# Patient Record
Sex: Female | Born: 1980 | Race: Black or African American | Hispanic: No | Marital: Married | Smoking: Former smoker
Health system: Southern US, Community
[De-identification: ages and names within clinical notes are randomized; demographics above are authoritative.]

## PROBLEM LIST (undated history)

## (undated) DIAGNOSIS — E669 Obesity, unspecified: Secondary | ICD-10-CM

## (undated) DIAGNOSIS — F419 Anxiety disorder, unspecified: Secondary | ICD-10-CM

## (undated) DIAGNOSIS — I10 Essential (primary) hypertension: Secondary | ICD-10-CM

## (undated) DIAGNOSIS — E119 Type 2 diabetes mellitus without complications: Secondary | ICD-10-CM

## (undated) HISTORY — DX: Obesity, unspecified: E66.9

## (undated) HISTORY — PX: OTHER SURGICAL HISTORY: SHX169

## (undated) HISTORY — DX: Essential (primary) hypertension: I10

## (undated) HISTORY — DX: Anxiety disorder, unspecified: F41.9

---

## 2005-01-04 ENCOUNTER — Other Ambulatory Visit: Payer: Self-pay

## 2005-01-04 ENCOUNTER — Emergency Department: Payer: Self-pay | Admitting: Emergency Medicine

## 2006-01-26 ENCOUNTER — Emergency Department: Payer: Self-pay | Admitting: Emergency Medicine

## 2006-11-27 ENCOUNTER — Emergency Department: Payer: Self-pay | Admitting: Emergency Medicine

## 2007-07-21 ENCOUNTER — Emergency Department: Payer: Self-pay | Admitting: Internal Medicine

## 2007-10-23 ENCOUNTER — Ambulatory Visit: Payer: Self-pay | Admitting: Obstetrics and Gynecology

## 2008-05-31 ENCOUNTER — Emergency Department: Payer: Self-pay | Admitting: Emergency Medicine

## 2008-09-29 ENCOUNTER — Emergency Department: Payer: Self-pay

## 2008-10-29 ENCOUNTER — Ambulatory Visit: Payer: Self-pay | Admitting: Obstetrics and Gynecology

## 2008-11-06 ENCOUNTER — Ambulatory Visit: Payer: Self-pay | Admitting: Obstetrics and Gynecology

## 2008-12-24 ENCOUNTER — Ambulatory Visit: Payer: Self-pay | Admitting: Family Medicine

## 2009-02-28 ENCOUNTER — Emergency Department: Payer: Self-pay | Admitting: Emergency Medicine

## 2009-03-15 ENCOUNTER — Ambulatory Visit: Payer: Self-pay | Admitting: Family Medicine

## 2009-08-14 ENCOUNTER — Emergency Department: Payer: Self-pay | Admitting: Emergency Medicine

## 2009-12-21 ENCOUNTER — Emergency Department: Payer: Self-pay | Admitting: Emergency Medicine

## 2011-01-17 ENCOUNTER — Ambulatory Visit: Payer: Self-pay | Admitting: Obstetrics and Gynecology

## 2011-02-11 ENCOUNTER — Observation Stay: Payer: Self-pay

## 2011-02-13 ENCOUNTER — Ambulatory Visit: Payer: Self-pay | Admitting: Obstetrics and Gynecology

## 2011-06-06 ENCOUNTER — Ambulatory Visit: Payer: Self-pay | Admitting: Obstetrics and Gynecology

## 2011-06-06 LAB — CBC WITH DIFFERENTIAL/PLATELET
Basophil #: 0 10*3/uL (ref 0.0–0.1)
Eosinophil #: 0.1 10*3/uL (ref 0.0–0.7)
Eosinophil %: 1 %
HCT: 33.7 % — ABNORMAL LOW (ref 35.0–47.0)
HGB: 10.9 g/dL — ABNORMAL LOW (ref 12.0–16.0)
Lymphocyte %: 19.3 %
MCHC: 32.4 g/dL (ref 32.0–36.0)
Monocyte %: 6.3 %
Neutrophil #: 7.9 10*3/uL — ABNORMAL HIGH (ref 1.4–6.5)
WBC: 10.9 10*3/uL (ref 3.6–11.0)

## 2011-06-07 ENCOUNTER — Inpatient Hospital Stay: Payer: Self-pay | Admitting: Obstetrics and Gynecology

## 2011-06-08 LAB — HEMATOCRIT: HCT: 30.1 % — ABNORMAL LOW (ref 35.0–47.0)

## 2011-07-21 ENCOUNTER — Ambulatory Visit: Payer: Self-pay | Admitting: Obstetrics and Gynecology

## 2012-03-12 ENCOUNTER — Emergency Department: Payer: Self-pay | Admitting: Emergency Medicine

## 2012-03-15 LAB — BETA STREP CULTURE(ARMC)

## 2012-08-28 ENCOUNTER — Emergency Department: Payer: Self-pay | Admitting: Emergency Medicine

## 2012-08-30 LAB — BETA STREP CULTURE(ARMC)

## 2013-10-15 ENCOUNTER — Emergency Department: Payer: Self-pay | Admitting: Emergency Medicine

## 2014-09-06 NOTE — Discharge Summary (Signed)
PATIENT NAME:  Frances Turner, Frances Turner MR#:  [id number] DATE OF BIRTH:  1981-02-04  DATE OF ADMISSION:  06/07/2011 DATE OF DISCHARGE:  06/10/2011  PRINCIPLE PROCEDURE: Elective repeat cesarean section.   HOSPITAL COURSE: Postoperatively the patient did well. Postoperative day number one hematocrit 30.1%. The patient was discharged to home on postoperative day number three in good condition.   Her discharge medications are prenatal vitamins, Vicodin, and Motrin.   The patient's staples were removed and Steri-Strips applied.   The patient will follow-up with Dr. Feliberto GottronSchermerhorn in two weeks for wound care or before if she has wound drainage, fever, nausea, vomiting, or wound separation. ____________________________ Suzy Bouchardhomas J. Schermerhorn, MD tjs:slb D: 06/21/2011 09:24:47 ET T: 06/21/2011 11:36:50 ET JOB#: [id number]  cc: Suzy Bouchardhomas J. Schermerhorn, MD, <Dictator> Suzy BouchardHOMAS J SCHERMERHORN MD ELECTRONICALLY SIGNED 06/22/2011 8:44

## 2014-09-06 NOTE — Op Note (Signed)
PATIENT NAME:  Frances Turner, [name] MR#:  [id number] DATE OF BIRTH:  1981/05/05  DATE OF PROCEDURE:  06/07/2011  PREOPERATIVE DIAGNOSES:  1. Elective repeat cesarean section.  2. Elective permanent sterilization.   POSTOPERATIVE DIAGNOSES:  1. Elective repeat cesarean section.  2. Elective permanent sterilization.   PROCEDURES:  1. Repeat low transverse cesarean section.  2. Adhesiolysis. 3. Bilateral tubal ligation, [place].   SURGEON: Jennell Cornerhomas Vickie Ponds, MD  FIRST ASSISTANT: Dolman - Scrub Tech  ANESTHESIA: Spinal.   INDICATION: This is a 34 year old gravida 3, para 2, a patient with two prior cesarean sections. The patient's estimate date of confinement is 06/14/2011. The patient elects for repeat cesarean section and permanent sterilization. The patient reconfirms her desire to have permanent sterilization on the day of the procedure.   DESCRIPTION OF PROCEDURE: After adequate spinal anesthesia, the patient was placed in the dorsal supine position with a hip roll under the right side. The patient previously received 3 grams of IV Ancef. The abdomen was prepped and draped in normal sterile fashion. A Pfannenstiel incision was made two fingerbreadths above the symphysis pubis. Sharp dissection was used to identify the fascia. The fascia was opened in the midline and opened in a transverse fashion. The peritoneal cavity was entered sharply. Multiple peritoneal adhesions to the lower uterine segment and sidewalls were removed sharply. A low transverse uterine incision was made. Upon entry into the endometrial cavity, clear fluid resulted. The fetal head was brought to the incision, vacuum applied to the fetal occiput, and the fetal head delivered without difficulty with one gentle pull. The vacuum was removed. The shoulders and body were removed without difficulty. A loose nuchal cord was reduced, a vigorous female,  the cord was doubly clamped, and the infant was passed to Dr. Awanda MinkGaliote who  assigned Apgar scores of 9 and 9. The placenta was manually delivered and the uterus was exteriorized, intravenous Pitocin administered. The endometrial cavity was wiped clean with laparotomy tape. The cervix was opened with a ring forceps and this was passed off the operative field. The uterine incision was closed with one chromic suture in a running locking fashion with good approximation of edges. Good hemostasis was noted. Several additional figure-of-eight sutures were applied on the anterior portion of the uterine serosa given generous oozing. Several sutures were placed with good hemostasis noted. The fallopian tubes and ovaries appeared normal. The posterior cul-de-sac was suctioned and attention directed to the patient's right fallopian tube which was grasped at the midportion of the fallopian tube. Two separate 0 plain gut sutures were placed. A 1.5 cm portion of the fallopian tube was removed. A similar procedure was repeated on the patient's left fallopian tube. After placing 2-0 plain gut sutures, a 1.5 cm portion of the fallopian tube was removed. Good hemostasis was noted. Fallopian tube segments will be sent to pathology for identification. The uterus was placed back into the abdominal cavity. The paracolic gutters were wiped clean with laparotomy tape. The tubal ligation sites appeared hemostatic bilaterally. The uterine incision appeared hemostatic. Interceed was placed over the serosal edge and the uterine incision. An 0-Vicryl suture was used to close the fascia. The fascia was closed without defect. Good hemostasis was noted. The subcutaneous tissues were irrigated and bovied for hemostasis. Dense scarring of the Scarpa's, at the inferior edge of the incision, was opened with the Bovie and allowed for loosening of the tissues. The skin was reapproximated with staples, sterile dressing applied. The patient tolerated the procedure well. There were  no complications. Estimated blood 700 mL.  Intraoperative fluids 1000 mL.  ____________________________ Suzy Bouchard, MD tjs:slb D: 06/07/2011 08:47:27 ET T: 06/07/2011 09:31:24 ET JOB#: [id number]  cc: Suzy Bouchard, MD, <Dictator> Suzy Bouchard MD ELECTRONICALLY SIGNED 06/08/2011 20:33

## 2014-10-02 ENCOUNTER — Encounter: Payer: Self-pay | Admitting: Emergency Medicine

## 2014-10-02 ENCOUNTER — Emergency Department
Admission: EM | Admit: 2014-10-02 | Discharge: 2014-10-02 | Disposition: A | Discharge status: Home / Self Care | Payer: Self-pay | Attending: Emergency Medicine | Admitting: Emergency Medicine

## 2014-10-02 ENCOUNTER — Emergency Department: Payer: Self-pay

## 2014-10-02 DIAGNOSIS — M25511 Pain in right shoulder: Secondary | ICD-10-CM | POA: Insufficient documentation

## 2014-10-02 DIAGNOSIS — Z720 Tobacco use: Secondary | ICD-10-CM | POA: Insufficient documentation

## 2014-10-02 DIAGNOSIS — G8929 Other chronic pain: Secondary | ICD-10-CM | POA: Insufficient documentation

## 2014-10-02 DIAGNOSIS — Z79899 Other long term (current) drug therapy: Secondary | ICD-10-CM | POA: Insufficient documentation

## 2014-10-02 MED ORDER — ETODOLAC 400 MG PO TABS
400.0000 mg | ORAL_TABLET | Freq: Two times a day (BID) | ORAL | Status: DC
Start: 2014-10-02 — End: 2017-07-04

## 2014-10-02 NOTE — ED Notes (Signed)
Patient reports right shoulder pain for a couple of months. 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 Also reports some hand tingling.

## 2014-10-02 NOTE — ED Provider Notes (Signed)
[facility] Emergency Department Provider Note  ____________________________________________  Time seen: Approximately 9:36 AM  I have reviewed the triage vital signs and the nursing notes.   HISTORY  Chief Complaint Shoulder Pain  HPI Frances Turner is a 34 y.o. female is in today with complaint of right shoulder pain for 3 months. She is unaware of any particular injury that is caused this. 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 The last 4 days she has been taking ibuprofen 2 every 4 hours without much relief. Range of motion increases her pain sitting still decreases the pain. Pain currently is an 8 out of 10. She does not have a primary care doctor.   History reviewed. No pertinent past medical history.  There are no active problems to display for this patient.   Past Surgical History  Procedure Laterality Date  . Cesarean section      Current Outpatient Rx  Name  Route  Sig  Dispense  Refill  . Multiple Vitamin (MULTIVITAMIN) tablet   Oral   Take 1 tablet by mouth daily.         Marland Kitchen. etodolac (LODINE) 400 MG tablet   Oral   Take 1 tablet (400 mg total) by mouth 2 (two) times daily.   20 tablet   0     Allergies Review of patient's allergies indicates no known allergies.  Family History  Problem Relation Age of Onset  . Diabetes Mother   . Lupus Mother   . Osteoarthritis Mother   . Hyperlipidemia Mother   . Diabetes Father   . Hyperlipidemia Father   . Hypertension Father     Social History History  Substance Use Topics  . Smoking status: Current Every Day Smoker -- 0.50 packs/day  . Smokeless tobacco: Never Used  . Alcohol Use: No    Review of Systems Constitutional: No fever/chills Eyes: No visual changes. ENT: No sore throat. Cardiovascular: Denies chest pain. Respiratory: Denies shortness of breath. Gastrointestinal: No abdominal pain.  No nausea, no  vomiting. Genitourinary: Negative for dysuria. Musculoskeletal: Negative for back pain. Skin: Negative for rash. Neurological: Negative for headaches, focal weakness or numbness.  10-point ROS otherwise negative.  ____________________________________________   PHYSICAL EXAM:  VITAL SIGNS: ED Triage Vitals  Enc Vitals Group     BP 10/02/14 0842 142/89 mmHg     Pulse Rate 10/02/14 0842 78     Resp 10/02/14 0842 16     Temp 10/02/14 0842 98 F (36.7 C)     Temp Source 10/02/14 0842 Oral     SpO2 10/02/14 0842 100 %     Weight 10/02/14 0842 220 lb (99.791 kg)     Height 10/02/14 0842 5\' 7"  (1.702 m)     Head Cir --      Peak Flow --      Pain Score 10/02/14 0843 8     Pain Loc --      Pain Edu? --      Excl. in GC? --     Constitutional: Alert and oriented. Well appearing and in no acute distress. Eyes: Conjunctivae are normal. PERRL. EOMI. Head: Atraumatic. Nose: No congestion/rhinnorhea. Neck: No stridor.  No cervical tenderness on palpation. Cardiovascular: Normal rate, regular rhythm. Grossly normal heart sounds.  Good peripheral circulation. Respiratory: Normal respiratory effort.  No retractions. Lungs CTAB. Gastrointestinal: Soft and nontender. No distention. No abdominal bruits. Musculoskeletal: No  lower extremity tenderness nor edema.  No joint effusions. Range of motion in the right shoulder decreased secondary to pain. There is no crepitus noted. No gross deformity or discoloration. Trapezius muscles tenderness on palpation Neurologic:  Normal speech and language. No gross focal neurologic deficits are appreciated. Speech is normal. No gait instability. Skin:  Skin is warm, dry and intact. No rash noted. Psychiatric: Mood and affect are normal. Speech and behavior are normal.  ____________________________________________   LABS (all labs ordered are listed, but only abnormal results are displayed)  Labs Reviewed - No data to display RADIOLOGY  Right  shoulder x-rays per radiologist are negative. ____________________________________________   PROCEDURES  Procedure(s) performed: None  Critical Care performed: No  ____________________________________________   INITIAL IMPRESSION / ASSESSMENT AND PLAN / ED COURSE  Pertinent labs & imaging results that were available during my care of the patient were reviewed by me and considered in my medical decision making (see chart for details).  Patient was started on Lodine 400 mg twice a day. She is also to follow-up with Dr. Rosita KeaMenz if any continued problems ____________________________________________   FINAL CLINICAL IMPRESSION(S) / ED DIAGNOSES  Final diagnoses:  Chronic shoulder pain, right      Tommi RumpsRhonda L Janith Nielson, PA-C 10/02/14 1023  Governor Rooksebecca Lord, MD 10/02/14 1120

## 2015-03-17 ENCOUNTER — Emergency Department: Payer: Self-pay

## 2015-03-17 ENCOUNTER — Emergency Department
Admission: EM | Admit: 2015-03-17 | Discharge: 2015-03-17 | Disposition: A | Discharge status: Home / Self Care | Payer: Self-pay | Attending: Emergency Medicine | Admitting: Emergency Medicine

## 2015-03-17 ENCOUNTER — Encounter: Payer: Self-pay | Admitting: Emergency Medicine

## 2015-03-17 DIAGNOSIS — Y93B1 Activity, exercise machines primarily for muscle strengthening: Secondary | ICD-10-CM | POA: Insufficient documentation

## 2015-03-17 DIAGNOSIS — Z791 Long term (current) use of non-steroidal anti-inflammatories (NSAID): Secondary | ICD-10-CM | POA: Insufficient documentation

## 2015-03-17 DIAGNOSIS — S76011A Strain of muscle, fascia and tendon of right hip, initial encounter: Secondary | ICD-10-CM | POA: Insufficient documentation

## 2015-03-17 DIAGNOSIS — Y998 Other external cause status: Secondary | ICD-10-CM | POA: Insufficient documentation

## 2015-03-17 DIAGNOSIS — Y9289 Other specified places as the place of occurrence of the external cause: Secondary | ICD-10-CM | POA: Insufficient documentation

## 2015-03-17 DIAGNOSIS — Z720 Tobacco use: Secondary | ICD-10-CM | POA: Insufficient documentation

## 2015-03-17 DIAGNOSIS — G8929 Other chronic pain: Secondary | ICD-10-CM | POA: Insufficient documentation

## 2015-03-17 DIAGNOSIS — X58XXXA Exposure to other specified factors, initial encounter: Secondary | ICD-10-CM | POA: Insufficient documentation

## 2015-03-17 DIAGNOSIS — Z79899 Other long term (current) drug therapy: Secondary | ICD-10-CM | POA: Insufficient documentation

## 2015-03-17 MED ORDER — CYCLOBENZAPRINE HCL 10 MG PO TABS: 10.0000 mg | ORAL_TABLET | Freq: Three times a day (TID) | ORAL | Status: DC | PRN

## 2015-03-17 MED ORDER — IBUPROFEN 800 MG PO TABS: 800.0000 mg | ORAL_TABLET | Freq: Three times a day (TID) | ORAL | Status: DC | PRN

## 2015-03-17 MED ORDER — TRAMADOL HCL 50 MG PO TABS: 50.0000 mg | ORAL_TABLET | Freq: Four times a day (QID) | ORAL | Status: DC | PRN

## 2015-03-17 MED ORDER — KETOROLAC TROMETHAMINE 60 MG/2ML IM SOLN
60.0000 mg | Freq: Once | INTRAMUSCULAR | Status: AC
  Administered 2015-03-17: 60 mg via INTRAMUSCULAR
  Filled 2015-03-17: qty 2

## 2015-03-17 MED ORDER — ORPHENADRINE CITRATE 30 MG/ML IJ SOLN
60.0000 mg | Freq: Two times a day (BID) | INTRAMUSCULAR | Status: DC
  Administered 2015-03-17: 60 mg via INTRAMUSCULAR
  Filled 2015-03-17: qty 2

## 2015-03-17 NOTE — ED Provider Notes (Signed)
[facility] Emergency Department Provider Note  ____________________________________________  Time seen: Approximately 9:31 AM  I have reviewed the triage vital signs and the nursing notes.   HISTORY  Chief Complaint Hip Pain    HPI Frances Turner is a 34 y.o. female chronic hip pain for 2 years secondary to a fall. No medical evaluation after fall. Patient has been intermittently and worsening yesterday after doing Zumba exercises. No palliative measures taken for this complaint after the exercises. Patient states the pain increase ambulation. Patient is rating the pain as a 9/10.   History reviewed. No pertinent past medical history.  There are no active problems to display for this patient.   Past Surgical History  Procedure Laterality Date  . Cesarean section      Current Outpatient Rx  Name  Route  Sig  Dispense  Refill  . cyclobenzaprine (FLEXERIL) 10 MG tablet   Oral   Take 1 tablet (10 mg total) by mouth every 8 (eight) hours as needed for muscle spasms.   15 tablet   0   . etodolac (LODINE) 400 MG tablet   Oral   Take 1 tablet (400 mg total) by mouth 2 (two) times daily.   20 tablet   0   . ibuprofen (ADVIL,MOTRIN) 800 MG tablet   Oral   Take 1 tablet (800 mg total) by mouth every 8 (eight) hours as needed for moderate pain.   15 tablet   0   . Multiple Vitamin (MULTIVITAMIN) tablet   Oral   Take 1 tablet by mouth daily.         . traMADol (ULTRAM) 50 MG tablet   Oral   Take 1 tablet (50 mg total) by mouth every 6 (six) hours as needed for moderate pain.   12 tablet   0     Allergies Review of patient's allergies indicates no known allergies.  Family History  Problem Relation Age of Onset  . Diabetes Mother   . Lupus Mother   . Osteoarthritis Mother   . Hyperlipidemia Mother   . Diabetes Father   . Hyperlipidemia Father   . Hypertension Father     Social History Social History  Substance Use Topics  .  Smoking status: Current Every Day Smoker -- 0.50 packs/day  . Smokeless tobacco: Never Used  . Alcohol Use: No    Review of Systems Constitutional: No fever/chills Eyes: No visual changes. ENT: No sore throat. Cardiovascular: Denies chest pain. Respiratory: Denies shortness of breath. Gastrointestinal: No abdominal pain.  No nausea, no vomiting.  No diarrhea.  No constipation. Genitourinary: Negative for dysuria. Musculoskeletal: Right hip pain  Skin: Negative for rash. Neurological: Negative for headaches, focal weakness or numbness. 10-point ROS otherwise negative.  ____________________________________________   PHYSICAL EXAM:  VITAL SIGNS: ED Triage Vitals  Enc Vitals Group     BP 03/17/15 0926 136/89 mmHg     Pulse Rate 03/17/15 0926 83     Resp 03/17/15 0926 20     Temp 03/17/15 0926 98 F (36.7 C)     Temp Source 03/17/15 0926 Oral     SpO2 03/17/15 0926 98 %     Weight 03/17/15 0926 250 lb (113.399 kg)     Height 03/17/15 0926  (1.702 m)     Head Cir --      Peak Flow --      Pain Score 03/17/15 0926 9     Pain Loc --  Pain Edu? --      Excl. in GC? --    Constitutional: Alert and oriented. Well appearing and in no acute distress. Eyes: Conjunctivae are normal. PERRL. EOMI. Head: Atraumatic. Nose: No congestion/rhinnorhea. Mouth/Throat: Mucous membranes are moist.  Oropharynx non-erythematous. Neck: No stridor.  No cervical spine tenderness to palpation. Hematological/Lymphatic/Immunilogical: No cervical lymphadenopathy. Cardiovascular: Normal rate, regular rhythm. Grossly normal heart sounds.  Good peripheral circulation. Respiratory: Normal respiratory effort.  No retractions. Lungs CTAB. Gastrointestinal: Soft and nontender. No distention. No abdominal bruits. No CVA tenderness. Musculoskeletal: No lower extremity tenderness nor edema.  No joint effusions. Neurologic:  Normal speech and language. No gross focal neurologic deficits are  appreciated. No gait instability. Skin:  Skin is warm, dry and intact. No rash noted. Psychiatric: Mood and affect are normal. Speech and behavior are normal.  ____________________________________________   LABS (all labs ordered are listed, but only abnormal results are displayed)  Labs Reviewed - No data to display ____________________________________________  EKG   ____________________________________________  RADIOLOGY  No acute findings on x-ray ____________________________________________   PROCEDURES  Procedure(s) performed: None  Critical Care performed: No  ____________________________________________   INITIAL IMPRESSION / ASSESSMENT AND PLAN / ED COURSE  Pertinent labs & imaging results that were available during my care of the patient were reviewed by me and considered in my medical decision making (see chart for details).  Strain left hip. Discussed x-ray findings with patient. Patient given a prescription for tramadol, ibuprofen, and Flexeril. Patient given a work note for 2 days. Patient [facility]. ____________________________________________   FINAL CLINICAL IMPRESSION(S) / ED DIAGNOSES  Final diagnoses:  Hip strain, right, initial encounter      Joni ReiningRonald K Smith, PA-C 03/17/15 1023  Minna AntisKevin Paduchowski, MD 03/17/15 1538

## 2015-03-17 NOTE — ED Notes (Signed)
Left hip pain from fall two years ago. Pain on and off for two years but yesterday after doing some zumba exercises. Pt c/o increased pain in left hip when walking.

## 2015-03-17 NOTE — ED Notes (Signed)
Patient transported to X-ray 

## 2015-09-28 ENCOUNTER — Encounter: Payer: Self-pay | Admitting: Sports Medicine

## 2015-09-28 ENCOUNTER — Ambulatory Visit (INDEPENDENT_AMBULATORY_CARE_PROVIDER_SITE_OTHER): Payer: BLUE CROSS/BLUE SHIELD

## 2015-09-28 ENCOUNTER — Ambulatory Visit (INDEPENDENT_AMBULATORY_CARE_PROVIDER_SITE_OTHER): Payer: BLUE CROSS/BLUE SHIELD | Admitting: Sports Medicine

## 2015-09-28 DIAGNOSIS — M79673 Pain in unspecified foot: Secondary | ICD-10-CM

## 2015-09-28 DIAGNOSIS — M722 Plantar fascial fibromatosis: Secondary | ICD-10-CM | POA: Diagnosis not present

## 2015-09-28 DIAGNOSIS — L603 Nail dystrophy: Secondary | ICD-10-CM

## 2015-09-28 MED ORDER — METHYLPREDNISOLONE 4 MG PO TBPK: ORAL_TABLET | ORAL | Status: DC

## 2015-09-28 MED ORDER — MELOXICAM 15 MG PO TABS: 15.0000 mg | ORAL_TABLET | Freq: Every day | ORAL | Status: DC

## 2015-09-28 NOTE — Progress Notes (Signed)
Patient ID: Frances Turner, female   DOB: 10-26-1980, 35 y.o.   MRN: [id number] Subjective: Frances Turner is a 35 y.o. female patient presents to office with complaint of heel pain bilateral. Patient admits to post static dyskinesia for 1 year in duration. Patient has treated this problem with stretching with no relief.Admits also to concerning big toe nail thickness, desiring treatment. Denies any other pedal complaints.   There are no active problems to display for this patient.   Current Outpatient Prescriptions on File Prior to Visit  Medication Sig Dispense Refill  . cyclobenzaprine (FLEXERIL) 10 MG tablet Take 1 tablet (10 mg total) by mouth every 8 (eight) hours as needed for muscle spasms. 15 tablet 0  . etodolac (LODINE) 400 MG tablet Take 1 tablet (400 mg total) by mouth 2 (two) times daily. 20 tablet 0  . ibuprofen (ADVIL,MOTRIN) 800 MG tablet Take 1 tablet (800 mg total) by mouth every 8 (eight) hours as needed for moderate pain. 15 tablet 0  . Multiple Vitamin (MULTIVITAMIN) tablet Take 1 tablet by mouth daily.    . traMADol (ULTRAM) 50 MG tablet Take 1 tablet (50 mg total) by mouth every 6 (six) hours as needed for moderate pain. 12 tablet 0   No current facility-administered medications on file prior to visit.    No Known Allergies  Objective: Physical Exam General: The patient is alert and oriented x3 in no acute distress.  Dermatology: Skin is warm, dry and supple bilateral lower extremities. Nails 1-10 are  Short with thickness at bilateral hallux nails. There is no erythema, edema, no eccymosis, no open lesions present. Integument is otherwise unremarkable.  Vascular: Dorsalis Pedis pulse and Posterior Tibial pulse are 2/4 bilateral. Capillary fill time is immediate to all digits.  Neurological: Grossly intact to light touch with an achilles reflex of +2/5 and a  negative Tinel's sign bilateral.  Musculoskeletal: Tenderness to palpation at the medial calcaneal  tubercale and through the insertion of the plantar fascia on the left and right foot. No pain with compression of calcaneus bilateral. No pain with tuning fork to calcaneus bilateral. No pain with calf compression bilateral. There is decreased Ankle joint range of motion bilateral. All other joints range of motion within normal limits bilateral. Mild hammertoe bilateral. Strength 5/5 in all groups bilateral.   Xray, Right and Left foot:  Normal osseous mineralization. Joint spaces preserved. Mild hammertoe. No fracture/dislocation/boney destruction. Calcaneal spur present with mild thickening of plantar fascia. No other soft tissue abnormalities or radiopaque foreign bodies.   Assessment and Plan: Problem List Items Addressed This Visit    None    Visit Diagnoses    Foot pain, unspecified laterality    -  Primary    Relevant Medications    methylPREDNISolone (MEDROL DOSEPAK) 4 MG TBPK tablet    meloxicam (MOBIC) 15 MG tablet    Other Relevant Orders    DG Foot 2 Views Left    DG Foot 2 Views Right    Plantar fasciitis, bilateral        Relevant Medications    methylPREDNISolone (MEDROL DOSEPAK) 4 MG TBPK tablet    meloxicam (MOBIC) 15 MG tablet    Nail dystrophy        Relevant Orders    Fungus Culture with Smear       -Complete examination performed.  -Xrays reviewed -Discussed with patient in detail the condition of plantar fasciitis, how this occurs and general treatment options. Explained both conservative and surgical treatments.  -  Patient declined injection  -Rx Meloxicam to start after Medrol dose pack is completed -Recommended good supportive shoes and advised use of OTC insert. Explained to patient that if these orthoses work well, we will continue with these. If these do not improve her condition and  pain, we will consider custom molded orthoses. - Explained in detail the use of the fascial brace bilateral which was dispensed at today's visit. -Explained and dispensed to  patient daily stretching exercises. -Recommend patient to ice affected area 1-2x daily. -Fungal culture obtained from bilateral hallux nails and sent to [facility] -Patient to return to office in 4 weeks for follow up nail culture results and eval plantar fascia bilateral or sooner if problems or questions arise.  Frances Turner, DPM

## 2015-09-28 NOTE — Patient Instructions (Signed)

## 2015-10-25 ENCOUNTER — Other Ambulatory Visit: Payer: Self-pay | Admitting: Sports Medicine

## 2015-10-26 ENCOUNTER — Telehealth: Payer: Self-pay | Admitting: *Deleted

## 2015-10-26 ENCOUNTER — Encounter: Payer: Self-pay | Admitting: Sports Medicine

## 2015-10-26 ENCOUNTER — Ambulatory Visit: Payer: BLUE CROSS/BLUE SHIELD | Admitting: Sports Medicine

## 2015-10-26 ENCOUNTER — Ambulatory Visit (INDEPENDENT_AMBULATORY_CARE_PROVIDER_SITE_OTHER): Payer: BLUE CROSS/BLUE SHIELD | Admitting: Sports Medicine

## 2015-10-26 DIAGNOSIS — M79673 Pain in unspecified foot: Secondary | ICD-10-CM | POA: Diagnosis not present

## 2015-10-26 DIAGNOSIS — L603 Nail dystrophy: Secondary | ICD-10-CM

## 2015-10-26 DIAGNOSIS — M722 Plantar fascial fibromatosis: Secondary | ICD-10-CM | POA: Diagnosis not present

## 2015-10-26 MED ORDER — NUVAIL EX SOLN: 1.0000 [drp] | Freq: Every day | CUTANEOUS | Status: DC

## 2015-10-26 NOTE — Telephone Encounter (Addendum)
-----   Message from Asencion Islamitorya Stover, [place] sent at 10/26/2015 10:58 AM EDT ----- Regarding: Vickii ChafeNuvail Order Nuvail to apply to both big toe nails daily. [facility] to [facility].

## 2015-10-26 NOTE — Telephone Encounter (Signed)
Pt needs to make an appt prior to future refills. 

## 2015-10-26 NOTE — Progress Notes (Signed)
Patient ID: Frances FreestoneShonya Turner, female   DOB: April 25, 1981, 35 y.o.   MRN: [id number] Subjective: Frances Turner is a 35 y.o. female patient seen today in office for fungal culture results. Patient is also here for follow up of plantar fasciitis L>R. States that meds and brace helps. States that she wants to get injection this time as recommended. Patient has no other pedal complaints at this time.   There are no active problems to display for this patient.   Current Outpatient Prescriptions on File Prior to Visit  Medication Sig Dispense Refill  . cyclobenzaprine (FLEXERIL) 10 MG tablet Take 1 tablet (10 mg total) by mouth every 8 (eight) hours as needed for muscle spasms. 15 tablet 0  . etodolac (LODINE) 400 MG tablet Take 1 tablet (400 mg total) by mouth 2 (two) times daily. 20 tablet 0  . ibuprofen (ADVIL,MOTRIN) 800 MG tablet Take 1 tablet (800 mg total) by mouth every 8 (eight) hours as needed for moderate pain. 15 tablet 0  . meloxicam (MOBIC) 15 MG tablet Take 1 tablet (15 mg total) by mouth daily. 30 tablet 0  . methylPREDNISolone (MEDROL DOSEPAK) 4 MG TBPK tablet Take 1st as instructed then when done take anti-inflamatory 21 tablet 0  . Multiple Vitamin (MULTIVITAMIN) tablet Take 1 tablet by mouth daily.    . traMADol (ULTRAM) 50 MG tablet Take 1 tablet (50 mg total) by mouth every 6 (six) hours as needed for moderate pain. 12 tablet 0   No current facility-administered medications on file prior to visit.    No Known Allergies  Objective: Physical Exam  General: Well developed, nourished, no acute distress, awake, alert and oriented x 3  Vascular: Dorsalis pedis artery 2/4 bilateral, Posterior tibial artery 2/4 bilateral, skin temperature warm to warm proximal to distal bilateral lower extremities, no varicosities, pedal hair present bilateral.  Neurological: Gross sensation present via light touch bilateral.   Dermatological: Skin is warm, dry, and supple bilateral, Nails 1-10 are  tender, short thick, and discolored with mild subungal debris especially at bilateral hallux, no webspace macerations present bilateral, no open lesions present bilateral, no callus/corns/hyperkeratotic tissue present bilateral. No signs of infection bilateral.  Musculoskeletal: Mild tenderness to plantar fascia at medial insertion on calcaneus L>R. No pain with tuning fork to calcaneus or with compression bilateral. Asymptomatic hammertoe and pes planus boney deformities noted bilateral. Muscular strength within normal limits without painon range of motion. No pain with calf compression bilateral.  Fungal culture Negative suggestive of microtrauma  Assessment and Plan:  Problem List Items Addressed This Visit    None    Visit Diagnoses    Nail dystrophy    -  Primary    Plantar fasciitis, bilateral        L>R    Foot pain, unspecified laterality           -Examined patient -Discussed treatment options for painful dystrophic nails -Encouraged biomechanical changes/shoe changes -Rx Nuvail -Advised good hygiene habits -After oral consent and aseptic prep, injected a mixture containing 1 ml of 2%  plain lidocaine, 1 ml 0.5% plain marcaine, 0.5 ml of kenalog 10 and 0.5 ml of dexamethasone phosphate into left heel without complication. Post-injection care discussed with patient.  -Continue with icing, stretching, good supportive shoes, fascial braces; will determine wean at next visit -Patient to return in 4 weeks for follow up evaluation or sooner if symptoms worsen.  Asencion Islamitorya Ayat Drenning, DPM

## 2015-11-11 DIAGNOSIS — E161 Other hypoglycemia: Secondary | ICD-10-CM | POA: Insufficient documentation

## 2015-11-23 ENCOUNTER — Encounter: Payer: Self-pay | Admitting: Sports Medicine

## 2015-11-23 ENCOUNTER — Ambulatory Visit (INDEPENDENT_AMBULATORY_CARE_PROVIDER_SITE_OTHER): Payer: BLUE CROSS/BLUE SHIELD | Admitting: Sports Medicine

## 2015-11-23 DIAGNOSIS — M79673 Pain in unspecified foot: Secondary | ICD-10-CM | POA: Diagnosis not present

## 2015-11-23 DIAGNOSIS — M722 Plantar fascial fibromatosis: Secondary | ICD-10-CM

## 2015-11-23 DIAGNOSIS — L603 Nail dystrophy: Secondary | ICD-10-CM

## 2015-11-23 NOTE — Progress Notes (Signed)
Patient ID: Frances Turner, female   DOB: [birth year], 35 y.o.   MRN: [id number]  Subjective: Frances Turner is a 35 y.o. female patient seen today in office for follow up eval of nails and for follow up plantar fasciitis, L>R. States that meds and brace helps. States injection helped and has no pain now. Patient has no other pedal complaints at this time.   Patient Active Problem List   Diagnosis Date Noted  . Hypoglycemic reaction 11/11/2015    Current Outpatient Prescriptions on File Prior to Visit  Medication Sig Dispense Refill  . cyclobenzaprine (FLEXERIL) 10 MG tablet Take 1 tablet (10 mg total) by mouth every 8 (eight) hours as needed for muscle spasms. 15 tablet 0  . Dermatological Products, Misc. (NUVAIL) SOLN Apply 1 drop topically daily. 1 Bottle 11  . etodolac (LODINE) 400 MG tablet Take 1 tablet (400 mg total) by mouth 2 (two) times daily. 20 tablet 0  . ibuprofen (ADVIL,MOTRIN) 800 MG tablet Take 1 tablet (800 mg total) by mouth every 8 (eight) hours as needed for moderate pain. 15 tablet 0  . meloxicam (MOBIC) 15 MG tablet TAKE 1 TABLET (15 MG TOTAL) BY MOUTH DAILY. 30 tablet 0  . methylPREDNISolone (MEDROL DOSEPAK) 4 MG TBPK tablet Take 1st as instructed then when done take anti-inflamatory 21 tablet 0  . Multiple Vitamin (MULTIVITAMIN) tablet Take 1 tablet by mouth daily.    . traMADol (ULTRAM) 50 MG tablet Take 1 tablet (50 mg total) by mouth every 6 (six) hours as needed for moderate pain. 12 tablet 0   No current facility-administered medications on file prior to visit.    No Known Allergies  Objective: Physical Exam  General: Well developed, nourished, no acute distress, awake, alert and oriented x 3  Vascular: Dorsalis pedis artery 2/4 bilateral, Posterior tibial artery 2/4 bilateral, skin temperature warm to warm proximal to distal bilateral lower extremities, no varicosities, pedal hair present bilateral.  Neurological: Gross sensation present via light touch  bilateral.   Dermatological: Skin is warm, dry, and supple bilateral, Nails 1-10 are tender, short thick, and discolored with mild subungal debris especially at bilateral hallux, currently using Nuvail, no webspace macerations present bilateral, no open lesions present bilateral, no callus/corns/hyperkeratotic tissue present bilateral. No signs of infection bilateral.  Musculoskeletal:Decreased tenderness to plantar fascia at medial insertion on calcaneus L>R. No pain with tuning fork to calcaneus or with compression bilateral. Asymptomatic hammertoe and pes planus boney deformities noted bilateral. Muscular strength within normal limits without painon range of motion. No pain with calf compression bilateral.  Assessment and Plan:  Problem List Items Addressed This Visit    None    Visit Diagnoses    Plantar fasciitis, bilateral    -  Primary    improved    Foot pain, unspecified laterality        Nail dystrophy        Using Nuvail      -Examined patient -Continue with Nuvail as Rx -Advised continue good hygiene habits -No re-injection at this time due to improved fasciitis symptoms.  -Continue with icing, stretching, good supportive shoes, fascial brace as needed for left, wean as instructed. Advised patient in future may benefit from custom orthotics  -Patient to return as needed for follow up evaluation or sooner if symptoms worsen.  Asencion Islamitorya Cheo Selvey, DPM

## 2016-01-29 ENCOUNTER — Emergency Department
Admission: EM | Admit: 2016-01-29 | Discharge: 2016-01-29 | Disposition: A | Discharge status: Home / Self Care | Payer: BLUE CROSS/BLUE SHIELD | Attending: Emergency Medicine | Admitting: Emergency Medicine

## 2016-01-29 ENCOUNTER — Encounter: Payer: Self-pay | Admitting: Emergency Medicine

## 2016-01-29 DIAGNOSIS — E119 Type 2 diabetes mellitus without complications: Secondary | ICD-10-CM | POA: Diagnosis not present

## 2016-01-29 DIAGNOSIS — M545 Low back pain, unspecified: Secondary | ICD-10-CM

## 2016-01-29 DIAGNOSIS — Z79899 Other long term (current) drug therapy: Secondary | ICD-10-CM | POA: Diagnosis not present

## 2016-01-29 DIAGNOSIS — F17200 Nicotine dependence, unspecified, uncomplicated: Secondary | ICD-10-CM | POA: Diagnosis not present

## 2016-01-29 HISTORY — DX: Type 2 diabetes mellitus without complications: E11.9

## 2016-01-29 MED ORDER — DEXAMETHASONE SODIUM PHOSPHATE 10 MG/ML IJ SOLN
INTRAMUSCULAR | Status: AC
  Administered 2016-01-29: 10 mg via ORAL
  Filled 2016-01-29: qty 1

## 2016-01-29 MED ORDER — DEXAMETHASONE 10 MG/ML FOR PEDIATRIC ORAL USE
10.0000 mg | Freq: Once | INTRAMUSCULAR | Status: AC
  Administered 2016-01-29: 10 mg via ORAL

## 2016-01-29 MED ORDER — CYCLOBENZAPRINE HCL 5 MG PO TABS: 5.0000 mg | ORAL_TABLET | Freq: Three times a day (TID) | ORAL | 0 refills | Status: DC | PRN

## 2016-01-29 MED ORDER — PREDNISONE 10 MG PO TABS: 10.0000 mg | ORAL_TABLET | Freq: Every day | ORAL | 0 refills | Status: DC

## 2016-01-29 NOTE — ED Notes (Signed)
Pt states she has herniated disk and chronic back pain.

## 2016-01-29 NOTE — Discharge Instructions (Signed)
Please take medications as prescribed. Use a heating pad return to the ER for any worsening symptoms urgent changes in her health. Follow-up with orthopedics if no improvement in 5-7 days.

## 2016-01-29 NOTE — ED Provider Notes (Signed)
[facility]-EMERGENCY DEPARTMENT Provider Note   CSN: [id number] Arrival date & time: 01/29/16  1751     History   Chief Complaint Chief Complaint  Patient presents with  . Back Pain    HPI Frances Turner is a 35 y.o. female presents to the emergency department for evaluation of lower back pain. Patient states Thursday of this week, 2 days ago she developed midline lower back pain she describes an ache and tightness as constant and increased with standing and walking. She gets some relief with lying down. She denies any pain numbness tingling or radicular symptoms going on her lower extremities. No abdominal pain urinary symptoms, loss of bowel or bladder symptoms. She has been taking ibuprofen with no improvement. She's had similar symptoms in the past, found to have a L5-S1 disc herniation, has not been treated with any type of the assigned injections and currently does not have an orthopedist. Patient states prior to Thursday she was doing well with no symptoms. No trauma or injury.  HPI  Past Medical History:  Diagnosis Date  . Diabetes mellitus without complication [facility])     Patient Active Problem List   Diagnosis Date Noted  . Hypoglycemic reaction 11/11/2015    Past Surgical History:  Procedure Laterality Date  . CESAREAN SECTION      OB History    No data available       Home Medications    Prior to Admission medications   Medication Sig Start Date End Date Taking? Authorizing Provider  cyclobenzaprine (FLEXERIL) 5 MG tablet Take 1-2 tablets (5-10 mg total) by mouth 3 (three) times daily as needed for muscle spasms. 01/29/16   Evon Slack, PA-C  Dermatological Products, Misc. (NUVAIL) SOLN Apply 1 drop topically daily. 10/26/15   [name], DPM  etodolac (LODINE) 400 MG tablet Take 1 tablet (400 mg total) by mouth 2 (two) times daily. 10/02/14   Tommi Rumps, PA-C  ibuprofen (ADVIL,MOTRIN) 800 MG tablet Take 1 tablet (800 mg total) by mouth every 8 (eight)  hours as needed for moderate pain. 03/17/15   Joni Reining, PA-C  meloxicam (MOBIC) 15 MG tablet TAKE 1 TABLET (15 MG TOTAL) BY MOUTH DAILY. 10/26/15   [name], DPM  methylPREDNISolone (MEDROL DOSEPAK) 4 MG TBPK tablet Take 1st as instructed then when done take anti-inflamatory 09/28/15   [name], DPM  Multiple Vitamin (MULTIVITAMIN) tablet Take 1 tablet by mouth daily.    Historical Provider, MD  predniSONE (DELTASONE) 10 MG tablet Take 1 tablet (10 mg total) by mouth daily. 6,5,4,3,2,1 six day taper 01/29/16   Evon Slack, PA-C  traMADol (ULTRAM) 50 MG tablet Take 1 tablet (50 mg total) by mouth every 6 (six) hours as needed for moderate pain. 03/17/15   Joni Reining, PA-C    Family History Family History  Problem Relation Age of Onset  . Diabetes Mother   . Lupus Mother   . Osteoarthritis Mother   . Hyperlipidemia Mother   . Diabetes Father   . Hyperlipidemia Father   . Hypertension Father     Social History Social History  Substance Use Topics  . Smoking status: Current Every Day Smoker    Packs/day: 0.50  . Smokeless tobacco: Never Used  . Alcohol use No     Allergies   Review of patient's allergies indicates no known allergies.   Review of Systems Review of Systems  Constitutional: Negative for activity change, chills, fatigue and fever.  HENT: Negative for  congestion, sinus pressure and sore throat.   Eyes: Negative for visual disturbance.  Respiratory: Negative for cough, chest tightness and shortness of breath.   Cardiovascular: Negative for chest pain and leg swelling.  Gastrointestinal: Negative for abdominal pain, diarrhea, nausea and vomiting.  Genitourinary: Negative for dysuria.  Musculoskeletal: Positive for back pain. Negative for arthralgias and gait problem.  Skin: Negative for rash.  Neurological: Negative for weakness, numbness and headaches.  Hematological: Negative for adenopathy.  Psychiatric/Behavioral: Negative for agitation,  behavioral problems and confusion.     Physical Exam Updated Vital Signs BP (!) 97/47   Pulse 92   Temp 98.2 F (36.8 C) (Oral)   Resp 18   Ht 5\' 7"  (1.702 m)   Wt 119.3 kg   LMP 01/05/2016   SpO2 100%   BMI 41.19 kg/m   Physical Exam  Constitutional: She is oriented to person, place, and time. She appears well-developed and well-nourished. No distress.  HENT:  Head: Normocephalic and atraumatic.  Mouth/Throat: Oropharynx is clear and moist.  Eyes: EOM are normal. Pupils are equal, round, and reactive to light. Right eye exhibits no discharge. Left eye exhibits no discharge.  Neck: Normal range of motion. Neck supple.  Cardiovascular: Normal rate, regular rhythm and intact distal pulses.   Pulmonary/Chest: Effort normal and breath sounds normal. No respiratory distress. She exhibits no tenderness.  Abdominal: Soft. She exhibits no distension. There is no tenderness.  Musculoskeletal: She exhibits no edema.  Lumbar Spine: Examination of the lumbar spine reveals no bony abnormality, no edema, and no ecchymosis.  There is no step off.  The patient has full range of motion of the lumbar spine with flexion and extension.  The patient has normal lateral bend and rotation.  The patient has no pain with range of motion activities.  The patient has a negative axial load test, and a negative rotational Waddell test.  The patient is non tender along the spinous process.  The patient is  tender along the paravertebral muscles, with no muscle spasms.  The patient is non tender along the iliac crest.  The patient is non tender in the sciatic notch.  The patient is non tender along the Sacroiliac joint.  There is no Coccyx joint tenderness.    Bilateral Lower Extremities: Examination of the lower extremities reveals no bony abnormality, no edema, and no ecchymosis.  The patient has full active and passive range of motion of the hips, knees, and ankles.  There is no discomfort with range of motion  exercises.  The patient is non tender along the greater trochanter region.  The patient has a negative Denna HaggardHomans' test bilaterally.  There is normal skin warmth.  There is normal capillary refill bilaterally.    Neurologic: The patient has a negative straight leg raise.  The patient has normal muscle strength testing for the quadriceps, calves, ankle dorsiflexion, ankle plantarflexion, and extensor hallicus longus.  The patient has sensation that is intact to light touch.  The deep tendon reflexes are normal at the patella and achilles.   Neurological: She is alert and oriented to person, place, and time. She has normal reflexes.  Skin: Skin is warm and dry.  Psychiatric: She has a normal mood and affect. Her behavior is normal. Thought content normal.     ED Treatments / Results  Labs (all labs ordered are listed, but only abnormal results are displayed) Labs Reviewed - No data to display  EKG  EKG Interpretation None  Radiology No results found.  Procedures Procedures (including critical care time)  Medications Ordered in ED Medications  dexamethasone (DECADRON) 10 MG/ML injection for Pediatric ORAL use 10 mg (not administered)     Initial Impression / Assessment and Plan / ED Course  I have reviewed the triage vital signs and the nursing notes.  Pertinent labs & imaging results that were available during my care of the patient were reviewed by me and considered in my medical decision making (see chart for details).  Clinical Course    35 year old female with nontraumatic midline lower back pain. Has a history of lumbar disc herniation. She denies any radicular symptoms or weakness in the lower extremities. Patient's had similar symptoms in the past that responded well to steroids and muscle relaxers. We'll give 10 mg of dexamethasone IM followed by 6 day steroid taper with Flexeril.  Final Clinical Impressions(s) / ED Diagnoses   Final diagnoses:  Midline low back  pain without sciatica    New Prescriptions New Prescriptions   CYCLOBENZAPRINE (FLEXERIL) 5 MG TABLET    Take 1-2 tablets (5-10 mg total) by mouth 3 (three) times daily as needed for muscle spasms.   PREDNISONE (DELTASONE) 10 MG TABLET    Take 1 tablet (10 mg total) by mouth daily. 6,5,4,3,2,1 six day taper     Evon Slack, PA-C 01/29/16 1915    Jeanmarie Plant, MD 01/29/16 [phone]

## 2016-01-29 NOTE — ED Triage Notes (Signed)
Low back pain x 2 days, no new injury, history of same.

## 2016-03-01 ENCOUNTER — Encounter: Payer: Self-pay | Admitting: Podiatry

## 2016-03-01 ENCOUNTER — Ambulatory Visit (INDEPENDENT_AMBULATORY_CARE_PROVIDER_SITE_OTHER): Payer: BLUE CROSS/BLUE SHIELD | Admitting: Podiatry

## 2016-03-01 DIAGNOSIS — Q828 Other specified congenital malformations of skin: Secondary | ICD-10-CM

## 2016-03-01 DIAGNOSIS — M71571 Other bursitis, not elsewhere classified, right ankle and foot: Secondary | ICD-10-CM | POA: Diagnosis not present

## 2016-03-01 DIAGNOSIS — M2041 Other hammer toe(s) (acquired), right foot: Secondary | ICD-10-CM | POA: Diagnosis not present

## 2016-03-01 DIAGNOSIS — M7751 Other enthesopathy of right foot: Secondary | ICD-10-CM

## 2016-03-01 NOTE — Progress Notes (Signed)
She presents today with a chief complaint of a painful fifth digit of the right foot she states that "it's just killing me". States has been like this for quite some time and is far she can remember her fifth toes have always set up higher than the rest of her toes. I have reviewed her past medical history she denies changes in her past medical history medications allergies surgery social history.  Objective: Vital signs are stable alert and oriented 3 pulses are strongly palpable. She has contracted fifth metatarsophalangeal joint with skin contracture and soft tissue contracture as well. She has an overlying reactive hyperkeratotic lesion with soft fluctuance beneath it dorsolateral aspect of the PIPJ. There is no signs of infection. This does feel like a bursitis. This is exquisitely painful for her.  Assessment: Pain in limb secondary to cockup hammertoe deformity fifth right. Bursitis. Reactive hyperkeratosis.  Plan: I injected the bursitis today with dexamethasone and local anesthetic. I debrided all reactive hyperkeratosis. Stress surgical intervention with her today consisting of a release of the fifth metatarsophalangeal joint and a derotational arthroplasty with pinning of the fifth toe.  X-rays will need to be taken next time because our x-ray machine was down today.

## 2016-03-08 ENCOUNTER — Ambulatory Visit (INDEPENDENT_AMBULATORY_CARE_PROVIDER_SITE_OTHER): Payer: BLUE CROSS/BLUE SHIELD

## 2016-03-08 ENCOUNTER — Ambulatory Visit (INDEPENDENT_AMBULATORY_CARE_PROVIDER_SITE_OTHER): Payer: BLUE CROSS/BLUE SHIELD | Admitting: Podiatry

## 2016-03-08 VITALS — BP 129/83 | HR 90 | Resp 16

## 2016-03-08 DIAGNOSIS — M79674 Pain in right toe(s): Secondary | ICD-10-CM | POA: Diagnosis not present

## 2016-03-08 DIAGNOSIS — M2041 Other hammer toe(s) (acquired), right foot: Secondary | ICD-10-CM

## 2016-03-08 DIAGNOSIS — M21629 Bunionette of unspecified foot: Secondary | ICD-10-CM | POA: Diagnosis not present

## 2016-03-08 NOTE — Patient Instructions (Signed)
Pre-Operative Instructions  Congratulations, you have decided to take an important step to improving your quality of life.  You can be assured that the doctors of [facility] will be with you every step of the way.  1. Plan to be at the surgery center/hospital at least 1 (one) hour prior to your scheduled time unless otherwise directed by the surgical center/hospital staff.  You must have a responsible adult accompany you, remain during the surgery and drive you home.  Make sure you have directions to the surgical center/hospital and know how to get there on time. 2. For hospital based surgery you will need to obtain a history and physical form from your family physician within 1 month prior to the date of surgery- we will give you a form for you primary physician.  3. We make every effort to accommodate the date you request for surgery.  There are however, times where surgery dates or times have to be moved.  We will contact you as soon as possible if a change in schedule is required.   4. No Aspirin/Ibuprofen for one week before surgery.  If you are on aspirin, any non-steroidal anti-inflammatory medications (Mobic, Aleve, Ibuprofen) you should stop taking it 7 days prior to your surgery.  You make take Tylenol  For pain prior to surgery.  5. Medications- If you are taking daily heart and blood pressure medications, seizure, reflux, allergy, asthma, anxiety, pain or diabetes medications, make sure the surgery center/hospital is aware before the day of surgery so they may notify you which medications to take or avoid the day of surgery. 6. No food or drink after midnight the night before surgery unless directed otherwise by surgical center/hospital staff. 7. No alcoholic beverages 24 hours prior to surgery.  No smoking 24 hours prior to or 24 hours after surgery. 8. Wear loose pants or shorts- loose enough to fit over bandages, boots, and casts. 9. No slip on shoes, sneakers are best. 10. Bring  your boot with you to the surgery center/hospital.  Also bring crutches or a walker if your physician has prescribed it for you.  If you do not have this equipment, it will be provided for you after surgery. 11. If you have not been contracted by the surgery center/hospital by the day before your surgery, call to confirm the date and time of your surgery. 12. Leave-time from work may vary depending on the type of surgery you have.  Appropriate arrangements should be made prior to surgery with your employer. 13. Prescriptions will be provided immediately following surgery by your doctor.  Have these filled as soon as possible after surgery and take the medication as directed. 14. Remove nail polish on the operative foot. 15. Wash the night before surgery.  The night before surgery wash the foot and leg well with the antibacterial soap provided and water paying special attention to beneath the toenails and in between the toes.  Rinse thoroughly with water and dry well with a towel.  Perform this wash unless told not to do so by your physician.  Enclosed: 1 Ice pack (please put in freezer the night before surgery)   1 Hibiclens skin cleaner   Pre-op Instructions  If you have any questions regarding the instructions, do not hesitate to call our office.  [place]: [address] [place], [place] [address] [phone]  [place]: [address] [place], [place] [address] [phone]  [place]: [address]  [place], [place] [address] [phone]   Dr.   Norman Regal DPM, Dr. Matthew Wagoner DPM, Dr. M. Todd Hyatt DPM, Dr. Titorya Stover DPM 

## 2016-03-08 NOTE — Progress Notes (Signed)
She presents today for surgical consult regarding her right foot. She states it is not getting any better and the injection only lasted about 3 days.  Objective: Vital signs are stable she is alert and oriented 3. Pulses are palpable. She has a cocked up hammertoe deformity fifth right with pain on palpation of the fifth metatarsophalangeal joint and the PIP joint of the right fifth toe. 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 Hammertoe deformity with exostosis fifth digit right foot. No open lesions or wounds are noted.  Assessment: Pain in limb secondary to capsulitis tailor's bunion deformity hammertoe deformity fifth right.  Plan: We consented her today for a fifth metatarsal osteotomy a release of the metatarsophalangeal joint and hammertoe repair on the fifth metatarsophalangeal joint and fifth toe right foot. We discussed the possible postoperative palpitations which may include but are not limited to postop pain bleeding slightly infection recurrence and need for further surgery overcorrection and under correction. She understands this is amenable to it. She understands that she will be out of work possibly for as long as 8-12 weeks sono 3 pages of the consent form and a cam walker was provided for postop recovery period.

## 2016-03-09 ENCOUNTER — Telehealth: Payer: Self-pay | Admitting: *Deleted

## 2016-03-09 NOTE — Telephone Encounter (Signed)
"  I want to schedule my surgery with Dr. Al CorpusHyatt."  Do you have a date in mind?  I would like to do it as soon as possible."  He can do it the afternoon of November 9.  "That date will be fine."  You will need to register with the surgical center, instructions are in the brochure that was given to you in the blue bag.  Someone from the surgical center will call you a day or two prior to surgery date with the arrival time.  "Okay, thank you."

## 2016-03-13 ENCOUNTER — Telehealth: Payer: Self-pay | Admitting: *Deleted

## 2016-03-13 NOTE — Telephone Encounter (Signed)
Patient was scheduled for 03/23/2016.

## 2016-03-13 NOTE — Telephone Encounter (Signed)
"  I'm calling to schedule a new surgical appointment.  If you would please give me a call back."

## 2016-03-14 ENCOUNTER — Encounter: Payer: Self-pay | Admitting: Podiatry

## 2016-03-21 ENCOUNTER — Encounter: Payer: Self-pay | Admitting: Podiatry

## 2016-03-23 ENCOUNTER — Other Ambulatory Visit: Payer: Self-pay | Admitting: Podiatry

## 2016-03-23 ENCOUNTER — Encounter: Payer: Self-pay | Admitting: Podiatry

## 2016-03-23 DIAGNOSIS — M2041 Other hammer toe(s) (acquired), right foot: Secondary | ICD-10-CM

## 2016-03-23 DIAGNOSIS — S91114A Laceration without foreign body of right lesser toe(s) without damage to nail, initial encounter: Secondary | ICD-10-CM | POA: Diagnosis not present

## 2016-03-23 DIAGNOSIS — M7751 Other enthesopathy of right foot: Secondary | ICD-10-CM | POA: Diagnosis not present

## 2016-03-23 MED ORDER — CEPHALEXIN 500 MG PO CAPS: 500.0000 mg | ORAL_CAPSULE | Freq: Three times a day (TID) | ORAL | 0 refills | Status: DC

## 2016-03-23 MED ORDER — PROMETHAZINE HCL 25 MG PO TABS: 25.0000 mg | ORAL_TABLET | Freq: Three times a day (TID) | ORAL | 0 refills | Status: DC | PRN

## 2016-03-23 MED ORDER — OXYCODONE-ACETAMINOPHEN 10-325 MG PO TABS: 1.0000 | ORAL_TABLET | ORAL | 0 refills | Status: DC | PRN

## 2016-03-24 ENCOUNTER — Telehealth: Payer: Self-pay | Admitting: *Deleted

## 2016-03-24 NOTE — Telephone Encounter (Signed)
Pt states the Percocet is not taking care of the pain, just makes drowsy.  I asked pt to describe the pain and she states it is sharp. I asked pt if she could take OTC antiinflammatories and she said she could, I told her to take Ibuprofen OTC 2 tablets every 4-6 hours between the Percocet and to not be on the surgery foot more than 15 minutes/hour and to rest and ice. Pt states understanding.

## 2016-03-29 ENCOUNTER — Encounter: Payer: Self-pay | Admitting: Podiatry

## 2016-03-29 ENCOUNTER — Ambulatory Visit (INDEPENDENT_AMBULATORY_CARE_PROVIDER_SITE_OTHER): Payer: BLUE CROSS/BLUE SHIELD

## 2016-03-29 ENCOUNTER — Ambulatory Visit (INDEPENDENT_AMBULATORY_CARE_PROVIDER_SITE_OTHER): Payer: BLUE CROSS/BLUE SHIELD | Admitting: Podiatry

## 2016-03-29 VITALS — BP 135/77 | HR 81 | Resp 16

## 2016-03-29 DIAGNOSIS — Z9889 Other specified postprocedural states: Secondary | ICD-10-CM

## 2016-03-29 DIAGNOSIS — M2041 Other hammer toe(s) (acquired), right foot: Secondary | ICD-10-CM

## 2016-03-29 DIAGNOSIS — M21629 Bunionette of unspecified foot: Secondary | ICD-10-CM

## 2016-03-29 DIAGNOSIS — M2040 Other hammer toe(s) (acquired), unspecified foot: Secondary | ICD-10-CM

## 2016-03-29 NOTE — Progress Notes (Signed)
She presents today 1 week status post release fifth metatarsophalangeal joint hammertoe repair fifth right with K wire placement and skin plasty. She's doing very well I see no signs of infection. She is not complaining of pain.  Objective: Rest of dressing intact does not demonstrate any type of dirt or debris no bleeding. Once removed demonstrates mild bleeding D no signs of infection. No erythema cellulitis drainage or odor sutures intact margins well coapted skin flap is living even the most proximal margin is doing very well. Radiographs taken today do demonstrate K wire to the fifth digit with no signs of fracture or infection.  Assessment: Well healing surgical foot fifth metatarsophalangeal joint.  Plan: redress today dry sterile compressive dressing continues to help as much possible follow-up with me in 1 week for suture removal

## 2016-03-31 ENCOUNTER — Encounter: Payer: BLUE CROSS/BLUE SHIELD | Admitting: Podiatry

## 2016-04-10 ENCOUNTER — Ambulatory Visit (INDEPENDENT_AMBULATORY_CARE_PROVIDER_SITE_OTHER): Payer: BLUE CROSS/BLUE SHIELD | Admitting: Podiatry

## 2016-04-10 DIAGNOSIS — M21629 Bunionette of unspecified foot: Secondary | ICD-10-CM

## 2016-04-10 DIAGNOSIS — Z9889 Other specified postprocedural states: Secondary | ICD-10-CM

## 2016-04-10 DIAGNOSIS — M2041 Other hammer toe(s) (acquired), right foot: Secondary | ICD-10-CM

## 2016-04-10 MED ORDER — OXYCODONE-ACETAMINOPHEN 10-325 MG PO TABS: 1.0000 | ORAL_TABLET | ORAL | 0 refills | Status: DC | PRN

## 2016-04-11 NOTE — Progress Notes (Signed)
She presents today for her second postop visit she is status post skinplasty and release fifth metatarsophalangeal joint with K wire and hammertoe repair. She states that still having a lot of pain with the pin.  Objective: Sutures are in place margins appear to be well coapted. Appears to be healing nicely. There is no erythema cellulitis drainage or odor. K wire is in place to the fifth digit. No drainage no purulence mild edema.  Assessment: Well-healing surgical foot  Plan removed sutures today and redressed dry sterile compressive dressing follow-up with her in 2 weeks. Placed her in a Darco shoe today.

## 2016-04-26 ENCOUNTER — Encounter: Payer: Self-pay | Admitting: Podiatry

## 2016-04-26 ENCOUNTER — Ambulatory Visit (INDEPENDENT_AMBULATORY_CARE_PROVIDER_SITE_OTHER): Payer: BLUE CROSS/BLUE SHIELD | Admitting: Podiatry

## 2016-04-26 ENCOUNTER — Ambulatory Visit (INDEPENDENT_AMBULATORY_CARE_PROVIDER_SITE_OTHER): Payer: BLUE CROSS/BLUE SHIELD

## 2016-04-26 DIAGNOSIS — M79671 Pain in right foot: Secondary | ICD-10-CM

## 2016-04-26 DIAGNOSIS — R52 Pain, unspecified: Secondary | ICD-10-CM

## 2016-04-26 NOTE — Progress Notes (Signed)
She presents today for release of the fifth metatarsophalangeal joint date of surgery 03/23/2016 on the right foot. K wires intact she states that the K wire has been turning in the toe is still in the same position.  Objective: Vital signs are stable she is alert and oriented 3 K wires in good position sutures have gone on to the removed and margins are well healed. Radial breast today demonstrate good alignment of the toe think we can take this K wire out today.  Assessment: Nonsurgical foot right.  Plan: I remove the K wire today in suitable discomfort to the fifth metatarsophalangeal joint area she tolerated this but it was quite tender upon ambulating afterwards. I suggested that she continue to ambulate in the Darco shoe for the next 2-3 days and then consider a tennis shoe. On follow-up with her in 3-4 weeks.

## 2016-04-28 NOTE — Progress Notes (Signed)
DOS 11.09.2017 Derotational Arthroplasty 5th Toe Right, and Metatarsal Phalangeal Joint Release 5th Right; Possible 5th Metatarsal Osteotomy with Screw Right

## 2016-05-01 ENCOUNTER — Encounter: Payer: Self-pay | Admitting: Podiatry

## 2016-05-17 ENCOUNTER — Ambulatory Visit (INDEPENDENT_AMBULATORY_CARE_PROVIDER_SITE_OTHER): Payer: Self-pay | Admitting: Podiatry

## 2016-05-17 ENCOUNTER — Encounter: Payer: Self-pay | Admitting: Podiatry

## 2016-05-17 ENCOUNTER — Ambulatory Visit (INDEPENDENT_AMBULATORY_CARE_PROVIDER_SITE_OTHER): Payer: BLUE CROSS/BLUE SHIELD

## 2016-05-17 DIAGNOSIS — Z9889 Other specified postprocedural states: Secondary | ICD-10-CM | POA: Diagnosis not present

## 2016-05-17 DIAGNOSIS — M21629 Bunionette of unspecified foot: Secondary | ICD-10-CM

## 2016-05-17 DIAGNOSIS — M2041 Other hammer toe(s) (acquired), right foot: Secondary | ICD-10-CM

## 2016-05-17 NOTE — Progress Notes (Signed)
She presents today for follow-up of her fifth metatarsal phalangeal joint release right foot. Date of surgery 03/23/2016. States that she is doing okay but he gets a little achiness swells occasionally.  Objective: Vital signs are stable alert and oriented 3. Pulses are palpable. Neurologic sensorium is intact. She has great position of the fifth toe mild edema radiographs demonstrate complete arthroplasty PIPJ fifth digit right foot.  Assessment: Nonsurgical right.  Plan: Follow up with me in 1 month to 6 weeks. I recommended she get back to her regular activities.

## 2016-06-28 ENCOUNTER — Encounter: Payer: Self-pay | Admitting: Podiatry

## 2016-06-28 ENCOUNTER — Ambulatory Visit (INDEPENDENT_AMBULATORY_CARE_PROVIDER_SITE_OTHER): Payer: Self-pay | Admitting: Podiatry

## 2016-06-28 ENCOUNTER — Ambulatory Visit (INDEPENDENT_AMBULATORY_CARE_PROVIDER_SITE_OTHER): Payer: BLUE CROSS/BLUE SHIELD

## 2016-06-28 DIAGNOSIS — M21629 Bunionette of unspecified foot: Secondary | ICD-10-CM | POA: Diagnosis not present

## 2016-06-28 DIAGNOSIS — M2041 Other hammer toe(s) (acquired), right foot: Secondary | ICD-10-CM | POA: Diagnosis not present

## 2016-06-28 NOTE — Progress Notes (Signed)
She presents today for postop visit date of surgery 03/23/2016 for her final postop visit status post release of the fifth metatarsophalangeal joint with a skinplasty and hammertoe repair fifth right. States that she's been getting some shooting pains from the toe up into the foot.  Objective: Vital signs are stable she is alert and oriented 3. Pulses are palpable. Neurologic sensorium is intact. She has tenderness on palpation of the fifth digit along the incision site. All of the reactive hyperkeratosis to the toe and plantar aspect of the foot have resolved.  Assessment: While healing surgical foot with retention of scar tissue fifth digit right foot.  Plan: Operative injection today she declined. I instructed her to massage the scar tissue help break up the scar tissue to release the nerve from the entrapment. She understands this is amenable to it will notify me with questions or concerns.

## 2016-07-25 DIAGNOSIS — J309 Allergic rhinitis, unspecified: Secondary | ICD-10-CM | POA: Insufficient documentation

## 2016-12-27 ENCOUNTER — Emergency Department: Payer: BLUE CROSS/BLUE SHIELD

## 2016-12-27 ENCOUNTER — Encounter: Payer: Self-pay | Admitting: Emergency Medicine

## 2016-12-27 ENCOUNTER — Emergency Department
Admission: EM | Admit: 2016-12-27 | Discharge: 2016-12-27 | Disposition: A | Discharge status: Home / Self Care | Payer: BLUE CROSS/BLUE SHIELD | Attending: Emergency Medicine | Admitting: Emergency Medicine

## 2016-12-27 DIAGNOSIS — E119 Type 2 diabetes mellitus without complications: Secondary | ICD-10-CM | POA: Insufficient documentation

## 2016-12-27 DIAGNOSIS — R109 Unspecified abdominal pain: Secondary | ICD-10-CM

## 2016-12-27 DIAGNOSIS — R1011 Right upper quadrant pain: Secondary | ICD-10-CM | POA: Diagnosis not present

## 2016-12-27 DIAGNOSIS — F17200 Nicotine dependence, unspecified, uncomplicated: Secondary | ICD-10-CM | POA: Insufficient documentation

## 2016-12-27 DIAGNOSIS — R309 Painful micturition, unspecified: Secondary | ICD-10-CM | POA: Insufficient documentation

## 2016-12-27 DIAGNOSIS — R350 Frequency of micturition: Secondary | ICD-10-CM | POA: Insufficient documentation

## 2016-12-27 LAB — URINALYSIS, COMPLETE (UACMP) WITH MICROSCOPIC
BACTERIA UA: NONE SEEN
Bilirubin Urine: NEGATIVE
Glucose, UA: NEGATIVE mg/dL
HGB URINE DIPSTICK: NEGATIVE
Ketones, ur: NEGATIVE mg/dL
LEUKOCYTES UA: NEGATIVE
NITRITE: NEGATIVE
PH: 6 (ref 5.0–8.0)
PROTEIN: NEGATIVE mg/dL
RBC / HPF: NONE SEEN RBC/hpf (ref 0–5)
SPECIFIC GRAVITY, URINE: 1.02 (ref 1.005–1.030)

## 2016-12-27 LAB — LIPASE, BLOOD: LIPASE: 28 U/L (ref 11–51)

## 2016-12-27 LAB — CBC
HEMATOCRIT: 36.1 % (ref 35.0–47.0)
Hemoglobin: 12.2 g/dL (ref 12.0–16.0)
MCH: 28.2 pg (ref 26.0–34.0)
MCHC: 33.8 g/dL (ref 32.0–36.0)
MCV: 83.4 fL (ref 80.0–100.0)
Platelets: 255 10*3/uL (ref 150–440)
RBC: 4.32 MIL/uL (ref 3.80–5.20)
RDW: 14.6 % — AB (ref 11.5–14.5)
WBC: 10.6 10*3/uL (ref 3.6–11.0)

## 2016-12-27 LAB — COMPREHENSIVE METABOLIC PANEL
ALT: 18 U/L (ref 14–54)
AST: 22 U/L (ref 15–41)
Albumin: 3.8 g/dL (ref 3.5–5.0)
Alkaline Phosphatase: 78 U/L (ref 38–126)
Anion gap: 6 (ref 5–15)
BILIRUBIN TOTAL: 0.6 mg/dL (ref 0.3–1.2)
BUN: 13 mg/dL (ref 6–20)
CALCIUM: 9.2 mg/dL (ref 8.9–10.3)
CO2: 27 mmol/L (ref 22–32)
CREATININE: 1 mg/dL (ref 0.44–1.00)
Chloride: 104 mmol/L (ref 101–111)
Glucose, Bld: 98 mg/dL (ref 65–99)
Potassium: 3.7 mmol/L (ref 3.5–5.1)
Sodium: 137 mmol/L (ref 135–145)
TOTAL PROTEIN: 7.6 g/dL (ref 6.5–8.1)

## 2016-12-27 LAB — POCT PREGNANCY, URINE: PREG TEST UR: NEGATIVE

## 2016-12-27 MED ORDER — DIAZEPAM 5 MG PO TABS: 5.0000 mg | ORAL_TABLET | Freq: Three times a day (TID) | ORAL | 0 refills | Status: DC | PRN

## 2016-12-27 MED ORDER — IBUPROFEN 800 MG PO TABS: 800.0000 mg | ORAL_TABLET | Freq: Three times a day (TID) | ORAL | 0 refills | Status: DC | PRN

## 2016-12-27 MED ORDER — OXYCODONE-ACETAMINOPHEN 5-325 MG PO TABS
2.0000 | ORAL_TABLET | Freq: Once | ORAL | Status: AC
  Administered 2016-12-27: 2 via ORAL
  Filled 2016-12-27: qty 2

## 2016-12-27 NOTE — ED Triage Notes (Signed)
Pt to ed with c/o right flank pain that radiates around into abd x 1 week,  Frequency of urination and pain with urination.

## 2016-12-27 NOTE — ED Notes (Signed)

## 2016-12-27 NOTE — ED Provider Notes (Signed)
[facility] Emergency Department Provider Note       Time seen: ----------------------------------------- 10:01 AM on 12/27/2016 -----------------------------------------     I have reviewed the triage vital signs and the nursing notes.   HISTORY   Chief Complaint Flank Pain and Abdominal Pain    HPI Frances FreestoneShonya Turner is a 36 y.o. female who presents to the ED for right flank pain that radiates around her abdomen for the last week. Patient said increased frequency of urination and painful urination. She denies a history of this before, nothing makes it better or worse. She describes pain as aching and 8 out of 10.   Past Medical History:  Diagnosis Date  . Diabetes mellitus without complication [facility](HCC)     Patient Active Problem List   Diagnosis Date Noted  . Hypoglycemic reaction 11/11/2015    Past Surgical History:  Procedure Laterality Date  . CESAREAN SECTION      Allergies Patient has no known allergies.  Social History Social History  Substance Use Topics  . Smoking status: Current Every Day Smoker    Packs/day: 0.50  . Smokeless tobacco: Never Used  . Alcohol use No    Review of Systems Constitutional: Negative for fever. Eyes: Negative for vision changes ENT:  Negative for congestion, sore throat Cardiovascular: Negative for chest pain. Respiratory: Negative for shortness of breath. Gastrointestinal: Positive for flank pain Genitourinary: Positive for dysuria and polyuria Musculoskeletal: Negative for back pain. Skin: Negative for rash. Neurological: Negative for headaches, focal weakness or numbness.  All systems negative/normal/unremarkable except as stated in the HPI  ____________________________________________   PHYSICAL EXAM:  VITAL SIGNS: ED Triage Vitals  Enc Vitals Group     BP 12/27/16 0724 135/75     Pulse Rate 12/27/16 0724 77     Resp 12/27/16 0724 16     Temp 12/27/16 0724 98.2 F (36.8 C)     Temp  Source 12/27/16 0724 Oral     SpO2 12/27/16 0724 99 %     Weight 12/27/16 0725 260 lb (117.9 kg)     Height 12/27/16 0725 5\' 7"  (1.702 m)     Head Circumference --      Peak Flow --      Pain Score 12/27/16 0724 8     Pain Loc --      Pain Edu? --      Excl. in GC? --     Constitutional: Alert and oriented. Well appearing and in no distress. Eyes: Conjunctivae are normal. Normal extraocular movements. ENT   Head: Normocephalic and atraumatic.   Nose: No congestion/rhinnorhea.   Mouth/Throat: Mucous membranes are moist.   Neck: No stridor. Cardiovascular: Normal rate, regular rhythm. No murmurs, rubs, or gallops. Respiratory: Normal respiratory effort without tachypnea nor retractions. Breath sounds are clear and equal bilaterally. No wheezes/rales/rhonchi. Gastrointestinal: Mild right flank tenderness, no rebound or guarding. Normal bowel sounds. Musculoskeletal: Nontender with normal range of motion in extremities. No lower extremity tenderness nor edema. Neurologic:  Normal speech and language. No gross focal neurologic deficits are appreciated.  Skin:  Skin is warm, dry and intact. No rash noted. Psychiatric: Mood and affect are normal. Speech and behavior are normal.  ____________________________________________  ED COURSE:  Pertinent labs & imaging results that were available during my care of the patient were reviewed by me and considered in my medical decision making (see chart for details). Patient presents for flank pain, we will assess with labs and imaging as indicated.  Procedures ____________________________________________   LABS (pertinent positives/negatives)  Labs Reviewed  CBC - Abnormal; Notable for the following:       Result Value   RDW 14.6 (*)    All other components within normal limits  URINALYSIS, COMPLETE (UACMP) WITH MICROSCOPIC - Abnormal; Notable for the following:    Color, Urine YELLOW (*)    APPearance CLEAR (*)    Squamous  Epithelial / LPF 0-5 (*)    All other components within normal limits  LIPASE, BLOOD  COMPREHENSIVE METABOLIC PANEL  POC URINE PREG, ED  POCT PREGNANCY, URINE    RADIOLOGY Images were viewed by me  CT renal protocol  IMPRESSION: 1. No CT findings to account for the patient's right flank pain. No renal, ureteral or bladder calculi or mass. The appendix and ovaries appear normal. 2. Early atherosclerotic calcifications, advanced for age. ____________________________________________  FINAL ASSESSMENT AND PLAN  Flank pain  Plan: Patient's labs and imaging were dictated above. Patient had presented for flank pain of uncertain etiology. Flank pain is likely musculoskeletal, she'll be discharged with anti-inflammatory muscle relaxants.   Emily Filbert, MD   Note: This note was generated in part or whole with voice recognition software. Voice recognition is usually quite accurate but there are transcription errors that can and very often do occur. I apologize for any typographical errors that were not detected and corrected.     Emily Filbert, MD 12/27/16 1028

## 2017-07-04 ENCOUNTER — Encounter: Payer: Self-pay | Admitting: *Deleted

## 2017-07-04 ENCOUNTER — Ambulatory Visit
Admission: EM | Admit: 2017-07-04 | Discharge: 2017-07-04 | Disposition: A | Discharge status: Home / Self Care | Payer: BLUE CROSS/BLUE SHIELD | Attending: Emergency Medicine | Admitting: Emergency Medicine

## 2017-07-04 ENCOUNTER — Other Ambulatory Visit: Payer: Self-pay

## 2017-07-04 DIAGNOSIS — J069 Acute upper respiratory infection, unspecified: Secondary | ICD-10-CM | POA: Diagnosis not present

## 2017-07-04 LAB — RAPID INFLUENZA A&B ANTIGENS: Influenza B (ARMC): NEGATIVE

## 2017-07-04 LAB — RAPID INFLUENZA A&B ANTIGENS (ARMC ONLY): INFLUENZA A (ARMC): NEGATIVE

## 2017-07-04 MED ORDER — IBUPROFEN 600 MG PO TABS: 600.0000 mg | ORAL_TABLET | Freq: Four times a day (QID) | ORAL | 0 refills | Status: DC | PRN

## 2017-07-04 MED ORDER — HYDROCOD POLST-CPM POLST ER 10-8 MG/5ML PO SUER: 5.0000 mL | Freq: Two times a day (BID) | ORAL | 0 refills | Status: DC | PRN

## 2017-07-04 MED ORDER — FLUTICASONE PROPIONATE 50 MCG/ACT NA SUSP: 2.0000 | Freq: Every day | NASAL | 0 refills | Status: AC

## 2017-07-04 MED ORDER — BENZONATATE 200 MG PO CAPS: 200.0000 mg | ORAL_CAPSULE | Freq: Three times a day (TID) | ORAL | 0 refills | Status: DC | PRN

## 2017-07-04 NOTE — ED Triage Notes (Signed)
PAtient started having symptoms of cough, nasal congestion, and body aches 2 days ago. No complaint of sore throat.

## 2017-07-04 NOTE — Discharge Instructions (Signed)
Take the medication as written. Take 1 gram of tylenol with the 600 mg motrin up to 3-4 times a day as needed for pain and fever. This is an effective combination. Drink extra fluids. Start taking  the mucinex to keep the mucus secretions thin.  May start with Mucinex D, however, if this raises your blood pressure consistently, then switch to regular Mucinex.  Use a neti pot or the NeilMed sinus rinse as often as you want to to reduce nasal congestion. Follow the directions on the box.  Go to www.goodrx.com to look up your medications. This will give you a list of where you can find your prescriptions at the most affordable prices. Or ask the pharmacist what the cash price is, or if they have any other discount programs available to help make your medication more affordable. This can be less expensive than what you would pay with insurance.

## 2017-07-04 NOTE — ED Provider Notes (Signed)
HPI  SUBJECTIVE:  Frances Turner is a 37 y.o. female who presents with 2 days of clearish yellowish nasal congestion, rhinorrhea, postnasal drip, sinus pain and pressure which has since resolved.  She reports a cough productive of the same material as her nasal congestion, sneezing, body aches.  She wonders if she has the flu.  No fevers.  She denies postnasal drip, wheezing, chest pain, shortness of breath, dyspnea on exertion.  No upper dental pain.  No itchy, watery eyes.  No headaches.  She got a flu shot in October.  She works in the [employer] and not sure if she has had any flu contacts.  She denies ear pain, neck stiffness, rash, sore throat.  She states that she is coughing all night long and is unable to sleep.  No antibiotics in the past month.  No antipyretic in the past 6-8 hours.  She tried Claritin, Tylenol cold max.  States the Claritin is not working and that it always works for her allergies.  Tylenol Cold max seems to help.  No aggravating factors.  She has a past medical history of diabetes, states her glucose has been running within normal limits for her, and spring allergies.  No history of asthma, hypertension.  LMP: 2/15.  Denies the possibility being pregnant.  PMD: [facility].    Past Medical History:  Diagnosis Date  . Diabetes mellitus without complication [facility](HCC)     Past Surgical History:  Procedure Laterality Date  . CESAREAN SECTION      Family History  Problem Relation Age of Onset  . Diabetes Mother   . Lupus Mother   . Osteoarthritis Mother   . Hyperlipidemia Mother   . Diabetes Father   . Hyperlipidemia Father   . Hypertension Father     Social History   Tobacco Use  . Smoking status: Current Every Day Smoker    Packs/day: 0.50  . Smokeless tobacco: Never Used  Substance Use Topics  . Alcohol use: No  . Drug use: No    No current facility-administered medications for this encounter.   Current Outpatient Medications:  .  ferrous  gluconate (FERGON) 324 MG tablet, Take 324 mg by mouth daily with breakfast., Disp: , Rfl:  .  benzonatate (TESSALON) 200 MG capsule, Take 1 capsule (200 mg total) by mouth 3 (three) times daily as needed for cough., Disp: 30 capsule, Rfl: 0 .  chlorpheniramine-HYDROcodone (TUSSIONEX PENNKINETIC ER) 10-8 MG/5ML SUER, Take 5 mLs by mouth every 12 (twelve) hours as needed for cough., Disp: 120 mL, Rfl: 0 .  fluticasone (FLONASE) 50 MCG/ACT nasal spray, Place 2 sprays into both nostrils daily., Disp: 16 g, Rfl: 0 .  ibuprofen (ADVIL,MOTRIN) 600 MG tablet, Take 1 tablet (600 mg total) by mouth every 6 (six) hours as needed., Disp: 30 tablet, Rfl: 0 .  metFORMIN (GLUCOPHAGE) 500 MG tablet, Take one tablet each evening with supper., Disp: , Rfl:   No Known Allergies   ROS  As noted in HPI.   Physical Exam  BP (!) 160/89 (BP Location: Left Arm)   Pulse 89   Temp 98.6 F (37 C) (Oral)   Resp 18   Ht 5\' 7"  (1.702 m)   Wt 260 lb (117.9 kg)   LMP 06/29/2017   SpO2 100%   BMI 40.72 kg/m   Constitutional: Well developed, well nourished, no acute distress Eyes:  EOMI, conjunctiva normal bilaterally HENT: Normocephalic, atraumatic,mucus membranes moist and extensive clear rhinorrhea.  Erythematous, swollen  turbinates.  No sinus tenderness.  Normal oropharynx.  Positive postnasal drip.  No cobblestoning. Neck: No cervical lymphadenopathy Respiratory: Normal inspiratory effort, lungs clear bilaterally, good air movement Cardiovascular: Normal rate, regular rhythm, no murmurs, rubs, gallops GI: nondistended skin: No rash, skin intact Musculoskeletal: no deformities Neurologic: Alert & oriented x 3, no focal neuro deficits Psychiatric: Speech and behavior appropriate   ED Course   Medications - No data to display  Orders Placed This Encounter  Procedures  . Rapid Influenza A&B Antigens ([facility] only)    Standing Status:   Standing    Number of Occurrences:   1  . Droplet precaution     Standing Status:   Standing    Number of Occurrences:   1    Results for orders placed or performed during the hospital encounter of 07/04/17 (from the past 24 hour(s))  Rapid Influenza A&B Antigens ([facility] only)     Status: None   Collection Time: 07/04/17  4:23 PM  Result Value Ref Range   Influenza A ([facility]) NEGATIVE NEGATIVE   Influenza B ([facility]) NEGATIVE NEGATIVE   No results found.  ED Clinical Impression  Upper respiratory tract infection, unspecified type   ED Assessment/Plan   Narcotic database reviewed for this patient, and feel that the risk/benefit ratio today is favorable for proceeding with a prescription for controlled substance.  No opiate prescriptions since August 2018.   Checking rapid flu per patient request.  If negative, we will treat this as if this is a URI.  Advised Mucinex D, however she is to keep an eye on her blood pressure while taking this.  If her blood pressure remains persistently elevated while taking this, she will discontinue the Mucinex D and start regular Mucinex.  Also Flonase, saline nasal irrigation with a Lloyd Huger med sinus rinse, Tussionex for the cough at night, ibuprofen 600 mg take with 1 g of Tylenol 3-4 times a day.  Work note for 2 days.  Flu Negative.  Plan as above.  Discussed labs, MDM, plan and followup with patient. patient agrees with plan.   Meds ordered this encounter  Medications  . chlorpheniramine-HYDROcodone (TUSSIONEX PENNKINETIC ER) 10-8 MG/5ML SUER    Sig: Take 5 mLs by mouth every 12 (twelve) hours as needed for cough.    Dispense:  120 mL    Refill:  0  . benzonatate (TESSALON) 200 MG capsule    Sig: Take 1 capsule (200 mg total) by mouth 3 (three) times daily as needed for cough.    Dispense:  30 capsule    Refill:  0  . ibuprofen (ADVIL,MOTRIN) 600 MG tablet    Sig: Take 1 tablet (600 mg total) by mouth every 6 (six) hours as needed.    Dispense:  30 tablet    Refill:  0  . fluticasone (FLONASE) 50 MCG/ACT nasal  spray    Sig: Place 2 sprays into both nostrils daily.    Dispense:  16 g    Refill:  0    *This clinic note was created using Scientist, clinical (histocompatibility and immunogenetics). Therefore, there may be occasional mistakes despite careful proofreading.   ?   Domenick Gong, MD 07/04/17 1901

## 2018-02-18 ENCOUNTER — Other Ambulatory Visit: Payer: Self-pay

## 2018-02-18 ENCOUNTER — Emergency Department
Admission: EM | Admit: 2018-02-18 | Discharge: 2018-02-18 | Disposition: A | Discharge status: Home / Self Care | Payer: BLUE CROSS/BLUE SHIELD | Attending: Emergency Medicine | Admitting: Emergency Medicine

## 2018-02-18 DIAGNOSIS — F17210 Nicotine dependence, cigarettes, uncomplicated: Secondary | ICD-10-CM | POA: Insufficient documentation

## 2018-02-18 DIAGNOSIS — N3090 Cystitis, unspecified without hematuria: Secondary | ICD-10-CM

## 2018-02-18 DIAGNOSIS — N3091 Cystitis, unspecified with hematuria: Secondary | ICD-10-CM | POA: Diagnosis not present

## 2018-02-18 DIAGNOSIS — Z7984 Long term (current) use of oral hypoglycemic drugs: Secondary | ICD-10-CM | POA: Diagnosis not present

## 2018-02-18 DIAGNOSIS — M5126 Other intervertebral disc displacement, lumbar region: Secondary | ICD-10-CM | POA: Diagnosis not present

## 2018-02-18 DIAGNOSIS — M545 Low back pain, unspecified: Secondary | ICD-10-CM

## 2018-02-18 DIAGNOSIS — M7541 Impingement syndrome of right shoulder: Secondary | ICD-10-CM | POA: Diagnosis not present

## 2018-02-18 DIAGNOSIS — E119 Type 2 diabetes mellitus without complications: Secondary | ICD-10-CM | POA: Diagnosis not present

## 2018-02-18 LAB — URINALYSIS, COMPLETE (UACMP) WITH MICROSCOPIC
BACTERIA UA: NONE SEEN
BILIRUBIN URINE: NEGATIVE
Glucose, UA: NEGATIVE mg/dL
Ketones, ur: NEGATIVE mg/dL
LEUKOCYTES UA: NEGATIVE
NITRITE: NEGATIVE
PROTEIN: NEGATIVE mg/dL
Specific Gravity, Urine: 1.01 (ref 1.005–1.030)
pH: 7 (ref 5.0–8.0)

## 2018-02-18 LAB — POCT PREGNANCY, URINE: PREG TEST UR: NEGATIVE

## 2018-02-18 MED ORDER — MELOXICAM 15 MG PO TABS: 15.0000 mg | ORAL_TABLET | Freq: Every day | ORAL | 0 refills | Status: DC

## 2018-02-18 MED ORDER — METHOCARBAMOL 500 MG PO TABS: 500.0000 mg | ORAL_TABLET | Freq: Four times a day (QID) | ORAL | 0 refills | Status: DC

## 2018-02-18 NOTE — ED Notes (Signed)
See triage note  Presents with lower back pain   States pain started couple of days ago  Denies any injury  States pain occasionally radiates into right leg  Ambulates well to treatment room

## 2018-02-18 NOTE — ED Triage Notes (Signed)
Low back x 2 days. No new injury. History of back trouble.

## 2018-02-18 NOTE — ED Provider Notes (Signed)
[facility] Emergency Department Provider Note  ____________________________________________  Time seen: Approximately 5:45 PM  I have reviewed the triage vital signs and the nursing notes.   HISTORY  Chief Complaint Back Pain    HPI Frances Turner is a 37 y.o. female patient presents the emergency department with several complaints.  Patient's main complaint is lower back pain.  She reports that she has a ruptured disc in her lumbar spine and it intermittently gives her pain.  Patient denies any recent trauma, excess of motion or lifting.  No radicular symptoms.  Patient reports that often if left untreated lower back pain will increase her sciatica.  Currently, no radicular symptoms.  No bowel or bladder dysfunction, saddle anesthesia, paresthesias.  Patient also complains of mild dysuria.  Patient denies any frequency, abdominal pain, flank pain, hematuria.  Patient states that it is much more mild than her previous UTIs.  No fevers or chills.  No other urinary complaint.  Patient is also complaining of right anterior shoulder pain.  Patient reports that it is a burning sensation occasionally radiating down her right upper extremity.  No numbness or tingling in her hand.  No trauma to the area.  She denies any neck pain, neck stiffness, posterior shoulder pain, chest pain, shortness of breath.  No abdominal pain.  Patient has not tried medications for her complaints.  No other complaints at this time.    Past Medical History:  Diagnosis Date  . Diabetes mellitus without complication [facility])     Patient Active Problem List   Diagnosis Date Noted  . Hypoglycemic reaction 11/11/2015    Past Surgical History:  Procedure Laterality Date  . CESAREAN SECTION      Prior to Admission medications   Medication Sig Start Date End Date Taking? Authorizing Provider  benzonatate (TESSALON) 200 MG capsule Take 1 capsule (200 mg total) by mouth 3 (three) times daily  as needed for cough. 07/04/17   Domenick Gong, MD  chlorpheniramine-HYDROcodone [facility] PENNKINETIC ER) 10-8 MG/5ML SUER Take 5 mLs by mouth every 12 (twelve) hours as needed for cough. 07/04/17   Domenick Gong, MD  ferrous gluconate (FERGON) 324 MG tablet Take 324 mg by mouth daily with breakfast.    [provider]  fluticasone (FLONASE) 50 MCG/ACT nasal spray Place 2 sprays into both nostrils daily. 07/04/17   Domenick Gong, MD  ibuprofen (ADVIL,MOTRIN) 600 MG tablet Take 1 tablet (600 mg total) by mouth every 6 (six) hours as needed. 07/04/17   Domenick Gong, MD  meloxicam (MOBIC) 15 MG tablet Take 1 tablet (15 mg total) by mouth daily. 02/18/18   Brylon Brenning, Delorise Royals, PA-C  metFORMIN (GLUCOPHAGE) 500 MG tablet Take one tablet each evening with supper. 05/11/16 05/11/17  [provider]  methocarbamol (ROBAXIN) 500 MG tablet Take 1 tablet (500 mg total) by mouth 4 (four) times daily. 02/18/18   Ramsey Guadamuz, Delorise Royals, PA-C    Allergies Patient has no known allergies.  Family History  Problem Relation Age of Onset  . Diabetes Mother   . Lupus Mother   . Osteoarthritis Mother   . Hyperlipidemia Mother   . Diabetes Father   . Hyperlipidemia Father   . Hypertension Father     Social History Social History   Tobacco Use  . Smoking status: Current Every Day Smoker    Packs/day: 0.50  . Smokeless tobacco: Never Used  Substance Use Topics  . Alcohol use: No  . Drug use: No  Review of Systems  Constitutional: No fever/chills Eyes: No visual changes. No discharge ENT: No upper respiratory complaints. Cardiovascular: no chest pain. Respiratory: no cough. No SOB. Gastrointestinal: No abdominal pain.  No nausea, no vomiting.  No diarrhea.  No constipation. Genitourinary: Positive for mild dysuria.  No urinary frequency.. No hematuria.  No flank pain Musculoskeletal: Positive for lower back and right shoulder pain. Skin: Negative for rash,  abrasions, lacerations, ecchymosis. Neurological: Negative for headaches, focal weakness or numbness. 10-point ROS otherwise negative.  ____________________________________________   PHYSICAL EXAM:  VITAL SIGNS: ED Triage Vitals  Enc Vitals Group     BP 02/18/18 1647 (!) 153/98     Pulse Rate 02/18/18 1647 77     Resp 02/18/18 1647 20     Temp 02/18/18 1647 98.3 F (36.8 C)     Temp Source 02/18/18 1647 Oral     SpO2 02/18/18 1647 100 %     Weight 02/18/18 1648 266 lb (120.7 kg)     Height 02/18/18 1648 5\' 7"  (1.702 m)     Head Circumference --      Peak Flow --      Pain Score 02/18/18 1648 10     Pain Loc --      Pain Edu? --      Excl. in GC? --      Constitutional: Alert and oriented. Well appearing and in no acute distress. Eyes: Conjunctivae are normal. PERRL. EOMI. Head: Atraumatic. ENT:      Ears:       Nose: No congestion/rhinnorhea.      Mouth/Throat: Mucous membranes are moist.  Neck: No stridor.  No cervical spine tenderness to palpation.   Cardiovascular: Normal rate, regular rhythm. Normal S1 and S2.  Good peripheral circulation. Respiratory: Normal respiratory effort without tachypnea or retractions. Lungs CTAB. Good air entry to the bases with no decreased or absent breath sounds. Gastrointestinal: Bowel sounds 4 quadrants. Soft and nontender to palpation all 4 quadrants as well as suprapubic region.. No guarding or rigidity. No palpable masses. No distention. No CVA tenderness. Musculoskeletal: Full range of motion to all extremities. No gross deformities appreciated.  Visualization of the right shoulder reveals no visible signs of trauma.  Full range of motion to the shoulder.  Patient is mildly tender to palpation over the acromioclavicular joint space with no palpable abnormality or deficit.  No other tenderness to palpation of the osseous and muscular structures of the right shoulder.  Mildly positive Neer sign.  No other positive special tests of the  shoulder.  Radial pulse intact right upper extremity.  Sensation intact right upper extremity.  Visualization of the lumbar spine reveals no visible signs of trauma.  Patient has mild tenderness to palpation from L3-L5 with no palpable abnormality or step-off.  Mild paraspinal muscle tenderness in this region as well bilaterally.  No tenderness to palpation of bilateral sciatic notches.  Negative straight leg raise bilaterally.  Dorsalis pedis pulse intact bilateral lower extremities.  Sensation intact and equal bilateral lower extremities. Neurologic:  Normal speech and language. No gross focal neurologic deficits are appreciated.  Skin:  Skin is warm, dry and intact. No rash noted. Psychiatric: Mood and affect are normal. Speech and behavior are normal. Patient exhibits appropriate insight and judgement.   ____________________________________________   LABS (all labs ordered are listed, but only abnormal results are displayed)  Labs Reviewed  URINALYSIS, COMPLETE (UACMP) WITH MICROSCOPIC - Abnormal; Notable for the following components:  Result Value   Color, Urine STRAW (*)    APPearance CLEAR (*)    Hgb urine dipstick SMALL (*)    All other components within normal limits  POC URINE PREG, ED  POCT PREGNANCY, URINE   ____________________________________________  EKG   ____________________________________________  RADIOLOGY   No results found.  ____________________________________________    PROCEDURES  Procedure(s) performed:    Procedures    Medications - No data to display   ____________________________________________   INITIAL IMPRESSION / ASSESSMENT AND PLAN / ED COURSE  Pertinent labs & imaging results that were available during my care of the patient were reviewed by me and considered in my medical decision making (see chart for details).  Review of the [place] CSRS was performed in accordance of the [facility] prior to dispensing any controlled  drugs.  Clinical Course as of Feb 19 1999  Mon Feb 18, 2018  1610 Patient presents emergency department with multiple complaints.  Patient complains of lower back pain, right shoulder pain, mild dysuria.  Exam was overall reassuring with no acute findings.  Differential includes lumbago, sciatica, increased pain from her known ruptured disc.  Differential on the lower back also includes UTI, pyelonephritis.  Patient does have mild dysuria.  This is likely either cystitis, UTI, pyelonephritis, nephrolithiasis.  Anterior shoulder pain differential includes ruptured biceps tendon, rotator cuff tear, rotator cuff tendinitis, acromioclavicular joint separation, impingement syndrome.  At this time, no indication for imaging of either the shoulder or lumbar spine.  Patient will have urinalysis performed.   [JC]    Clinical Course User Index [JC] Jemery Stacey, Delorise Royals, PA-C     Patient's diagnosis is consistent with low back pain from a known herniated disc, cystitis, impingement on right shoulder.  Patient presented to the emergency department multiple complaints.  Patient reported low back pain, with a known herniated disc.  No concerning symptoms of saddle anesthesia, paresthesias, bowel or bladder changes.  At this time, no imaging in the lumbar spine is deemed necessary.  Patient reported some mild dysuria, however stated that this was not like a typical UTI.  No hematuria.  No history of nephrolithiasis.  Urinalysis reveals no signs of infection.  Minimal hemoglobin at this time symptoms are not consistent with nephrolithiasis and no imaging is ordered.  Patient also had right shoulder pain.  Symptoms and physical exam are consistent with impingement syndrome.  Patient was placed on meloxicam, Robaxin for low back pain, impingement syndrome.  Patient is encouraged to drink plenty of fluids as well as take meloxicam for mild dysuria.  Follow-up with primary care as needed. Patient is given ED precautions to  return to the ED for any worsening or new symptoms.     ____________________________________________  FINAL CLINICAL IMPRESSION(S) / ED DIAGNOSES  Final diagnoses:  Impingement syndrome of right shoulder  Acute midline low back pain without sciatica  Lumbar herniated disc  Cystitis      NEW MEDICATIONS STARTED DURING THIS VISIT:  ED Discharge Orders         Ordered    meloxicam (MOBIC) 15 MG tablet  Daily     02/18/18 1959    methocarbamol (ROBAXIN) 500 MG tablet  4 times daily     02/18/18 1959              This chart was dictated using voice recognition software/Dragon. Despite best efforts to proofread, errors can occur which can change the meaning. Any change was purely unintentional.    Larz Mark, Christiane Ha  D, PA-C 02/18/18 2000    Minna Antis, MD 02/18/18 2333

## 2018-02-20 ENCOUNTER — Encounter: Payer: Self-pay | Admitting: Emergency Medicine

## 2018-02-20 ENCOUNTER — Other Ambulatory Visit: Payer: Self-pay

## 2018-02-20 ENCOUNTER — Emergency Department
Admission: EM | Admit: 2018-02-20 | Discharge: 2018-02-20 | Disposition: A | Discharge status: Home / Self Care | Payer: BLUE CROSS/BLUE SHIELD | Attending: Emergency Medicine | Admitting: Emergency Medicine

## 2018-02-20 DIAGNOSIS — M5432 Sciatica, left side: Secondary | ICD-10-CM | POA: Diagnosis not present

## 2018-02-20 DIAGNOSIS — F17200 Nicotine dependence, unspecified, uncomplicated: Secondary | ICD-10-CM | POA: Insufficient documentation

## 2018-02-20 DIAGNOSIS — M545 Low back pain: Secondary | ICD-10-CM | POA: Diagnosis present

## 2018-02-20 DIAGNOSIS — E119 Type 2 diabetes mellitus without complications: Secondary | ICD-10-CM | POA: Insufficient documentation

## 2018-02-20 DIAGNOSIS — Z7984 Long term (current) use of oral hypoglycemic drugs: Secondary | ICD-10-CM | POA: Insufficient documentation

## 2018-02-20 DIAGNOSIS — Z79899 Other long term (current) drug therapy: Secondary | ICD-10-CM | POA: Diagnosis not present

## 2018-02-20 LAB — GLUCOSE, CAPILLARY: Glucose-Capillary: 143 mg/dL — ABNORMAL HIGH (ref 70–99)

## 2018-02-20 LAB — FIBRIN DERIVATIVES D-DIMER (ARMC ONLY): Fibrin derivatives D-dimer (ARMC): 332.49 ng/mL (FEU) (ref 0.00–499.00)

## 2018-02-20 MED ORDER — METHYLPREDNISOLONE SODIUM SUCC 125 MG IJ SOLR
125.0000 mg | Freq: Once | INTRAMUSCULAR | Status: AC
  Administered 2018-02-20: 125 mg via INTRAVENOUS
  Filled 2018-02-20: qty 2

## 2018-02-20 MED ORDER — METHYLPREDNISOLONE 4 MG PO TBPK: ORAL_TABLET | ORAL | 0 refills | Status: DC

## 2018-02-20 NOTE — ED Notes (Signed)
See triage note  Presents with lower back pain  States pain si moving into left leg  States was seen on Monday  Pain has increased   Ambulates with slight limp

## 2018-02-20 NOTE — ED Provider Notes (Signed)
[facility] Emergency Department Provider Note   ____________________________________________   First MD Initiated Contact with Patient 02/20/18 1322     (approximate)  I have reviewed the triage vital signs and the nursing notes.   HISTORY  Chief Complaint Back Pain    HPI Frances Turner is a 37 y.o. female patient presents with low back pain with radicular component to the left lower extremity.  Patient was seen in the ED 2 days ago with same complaint and given prescription for meloxicam and Robaxin.  Patient needed on these medicines are helping.  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  Patient patient states that she normally responds well to steroids.  Patient denies bladder bowel dysfunction.  Patient rates her pain as a 10/10.  Except for the above-mentioned medication no other palliative measures for complaint.   Past Medical History:  Diagnosis Date  . Diabetes mellitus without complication [facility])     Patient Active Problem List   Diagnosis Date Noted  . Hypoglycemic reaction 11/11/2015    Past Surgical History:  Procedure Laterality Date  . CESAREAN SECTION      Prior to Admission medications   Medication Sig Start Date End Date Taking? Authorizing Provider  benzonatate (TESSALON) 200 MG capsule Take 1 capsule (200 mg total) by mouth 3 (three) times daily as needed for cough. 07/04/17   Domenick Gong, MD  chlorpheniramine-HYDROcodone [facility] PENNKINETIC ER) 10-8 MG/5ML SUER Take 5 mLs by mouth every 12 (twelve) hours as needed for cough. 07/04/17   Domenick Gong, MD  ferrous gluconate (FERGON) 324 MG tablet Take 324 mg by mouth daily with breakfast.    [provider]  fluticasone (FLONASE) 50 MCG/ACT nasal spray Place 2 sprays into both nostrils daily. 07/04/17   Domenick Gong, MD  ibuprofen (ADVIL,MOTRIN) 600 MG tablet Take 1 tablet (600 mg total) by mouth every 6 (six) hours as needed.  07/04/17   Domenick Gong, MD  meloxicam (MOBIC) 15 MG tablet Take 1 tablet (15 mg total) by mouth daily. 02/18/18   Cuthriell, Delorise Royals, PA-C  metFORMIN (GLUCOPHAGE) 500 MG tablet Take one tablet each evening with supper. 05/11/16 05/11/17  [provider]  methocarbamol (ROBAXIN) 500 MG tablet Take 1 tablet (500 mg total) by mouth 4 (four) times daily. 02/18/18   Cuthriell, Delorise Royals, PA-C  methylPREDNISolone (MEDROL DOSEPAK) 4 MG TBPK tablet Take Tapered dose as directed 02/20/18   Joni Reining, PA-C    Allergies Patient has no known allergies.  Family History  Problem Relation Age of Onset  . Diabetes Mother   . Lupus Mother   . Osteoarthritis Mother   . Hyperlipidemia Mother   . Diabetes Father   . Hyperlipidemia Father   . Hypertension Father     Social History Social History   Tobacco Use  . Smoking status: Current Every Day Smoker    Packs/day: 0.50  . Smokeless tobacco: Never Used  Substance Use Topics  . Alcohol use: No  . Drug use: No    Review of Systems Constitutional: No fever/chills Eyes: No visual changes. ENT: No sore throat. Cardiovascular: Denies chest pain. Respiratory: Denies shortness of breath. Gastrointestinal: No abdominal pain.  No nausea, no vomiting.  No diarrhea.  No constipation. Genitourinary: Negative for dysuria. Musculoskeletal: Negative for back pain. Skin: Negative for rash. Neurological: Negative for headaches, focal weakness or numbness. Endocrine:Diabetes.   ____________________________________________   PHYSICAL EXAM:  VITAL SIGNS: ED Triage Vitals  Enc  Vitals Group     BP 02/20/18 1303 (!) 149/118     Pulse Rate 02/20/18 1303 92     Resp 02/20/18 1303 16     Temp 02/20/18 1303 98.1 F (36.7 C)     Temp Source 02/20/18 1303 Oral     SpO2 02/20/18 1303 100 %     Weight 02/20/18 1304 260 lb (117.9 kg)     Height 02/20/18 1304 5\' 7"  (1.702 m)     Head Circumference --      Peak Flow --      Pain  Score 02/20/18 1303 10     Pain Loc --      Pain Edu? --      Excl. in GC? --    Constitutional: Alert and oriented. Well appearing and in no acute distress. Neck: No stridor.  Hematological/Lymphatic/Immunilogical: No cervical lymphadenopathy. Cardiovascular: Normal rate, regular rhythm. Grossly normal heart sounds.  Good peripheral circulation. Respiratory: Normal respiratory effort.  No retractions. Lungs CTAB. Musculoskeletal: No obvious spinal deformity.  Patient decreased range of motion with flexion left lateral movements.  Patient has moderate guarding palpation L4-S1.  Patient has negative straight leg test in supine position.. Neurologic:  Normal speech and language. No gross focal neurologic deficits are appreciated. No gait instability. Skin:  Skin is warm, dry and intact. No rash noted. Psychiatric: Mood and affect are normal. Speech and behavior are normal.  ____________________________________________   LABS (all labs ordered are listed, but only abnormal results are displayed)  Labs Reviewed  GLUCOSE, CAPILLARY - Abnormal; Notable for the following components:      Result Value   Glucose-Capillary 143 (*)    All other components within normal limits  FIBRIN DERIVATIVES D-DIMER ([facility] ONLY)  CBG MONITORING, ED   ____________________________________________  EKG   ____________________________________________  RADIOLOGY  ED MD interpretation:    Official radiology report(s): No results found.  ____________________________________________   PROCEDURES  Procedure(s) performed: None  Procedures  Critical Care performed: No  ____________________________________________   INITIAL IMPRESSION / ASSESSMENT AND PLAN / ED COURSE  As part of my medical decision making, I reviewed the following data within the electronic MEDICAL RECORD NUMBER    Patient presents with radicular left back pain to the left lower extremity.  Patient has a history of HNP.  Patient  given Solu-Medrol and a prescription for Medrol Dosepak.  Patient advised to discontinue the meloxicam while taking prednisone.  Patient advised to continue taking Robaxin.  Patient advised to follow-up with treating doctor for definitive evaluation and treatment.      ____________________________________________   FINAL CLINICAL IMPRESSION(S) / ED DIAGNOSES  Final diagnoses:  Sciatica of left side     ED Discharge Orders         Ordered    methylPREDNISolone (MEDROL DOSEPAK) 4 MG TBPK tablet     02/20/18 1401           Note:  This document was prepared using Dragon voice recognition software and may include unintentional dictation errors.    Joni Reining, PA-C 02/20/18 1405    Charlynne Pander, MD 02/21/18 [phone]

## 2018-02-20 NOTE — ED Triage Notes (Signed)
Pt presents to ED via POV c/o lower back pain radiating into L leg. States she was seen in this ED last Monday for same and given Rx PO meds that are not helping. States she has had pain relief with cortisone shots in the past.

## 2018-02-20 NOTE — Discharge Instructions (Addendum)
Discontinue meloxicam and start Medrol Dosepak as directed.  Closely monitor glucose levels while taking steroids.

## 2018-06-13 DIAGNOSIS — M7540 Impingement syndrome of unspecified shoulder: Secondary | ICD-10-CM | POA: Insufficient documentation

## 2018-06-19 DIAGNOSIS — E041 Nontoxic single thyroid nodule: Secondary | ICD-10-CM | POA: Insufficient documentation

## 2018-06-24 DIAGNOSIS — I10 Essential (primary) hypertension: Secondary | ICD-10-CM | POA: Insufficient documentation

## 2018-10-15 DIAGNOSIS — E66812 Obesity, class 2: Secondary | ICD-10-CM | POA: Insufficient documentation

## 2018-10-15 DIAGNOSIS — R29818 Other symptoms and signs involving the nervous system: Secondary | ICD-10-CM | POA: Insufficient documentation

## 2018-11-12 DIAGNOSIS — Z720 Tobacco use: Secondary | ICD-10-CM | POA: Insufficient documentation

## 2018-11-12 DIAGNOSIS — Z716 Tobacco abuse counseling: Secondary | ICD-10-CM | POA: Insufficient documentation

## 2019-01-21 DIAGNOSIS — E010 Iodine-deficiency related diffuse (endemic) goiter: Secondary | ICD-10-CM | POA: Insufficient documentation

## 2019-08-13 IMAGING — CT CT RENAL STONE PROTOCOL
2 of 4 series · 16 of 46 positions shown, 18 images · non-contrast
Comparison: None

CLINICAL DATA: Right flank pain for 1 week.

EXAM:
CT ABDOMEN AND PELVIS WITHOUT CONTRAST
TECHNIQUE: Multidetector CT imaging of the abdomen and pelvis was performed
following the standard protocol without IV contrast.

[Series 2: stone full standard · axial · 0.80mm/px · z∈[-526,-76]mm · 13 of 100 slices shown, 15 images]
[im 5/100  soft-tissue]
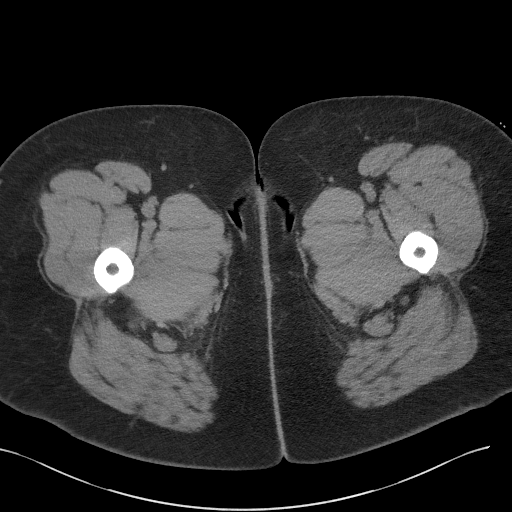
[im 5/100  bone]
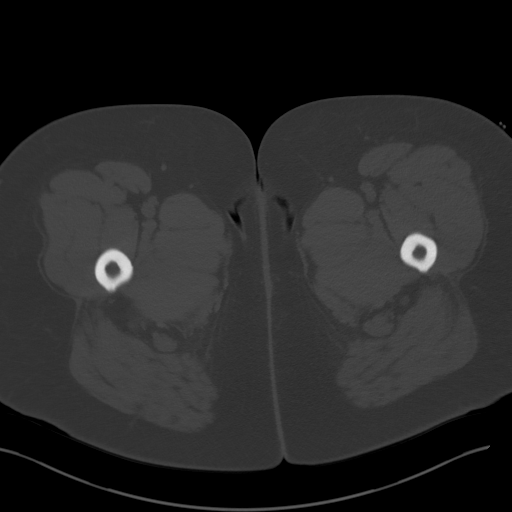
[im 13/100  soft-tissue]
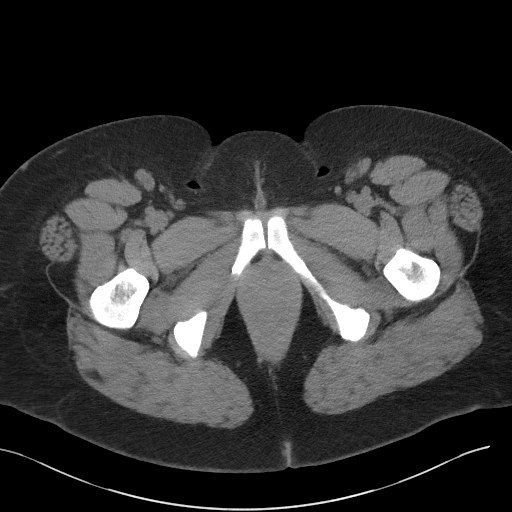
[im 22/100  soft-tissue]
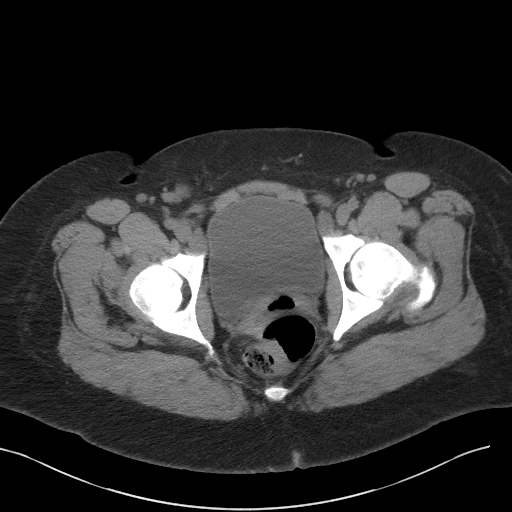
[im 26/100  soft-tissue]
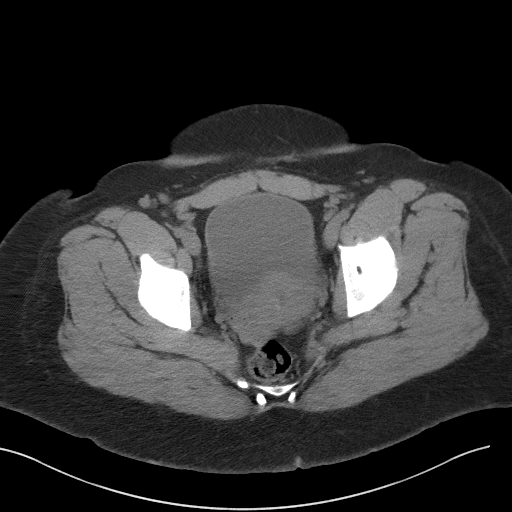
[im 35/100  soft-tissue]
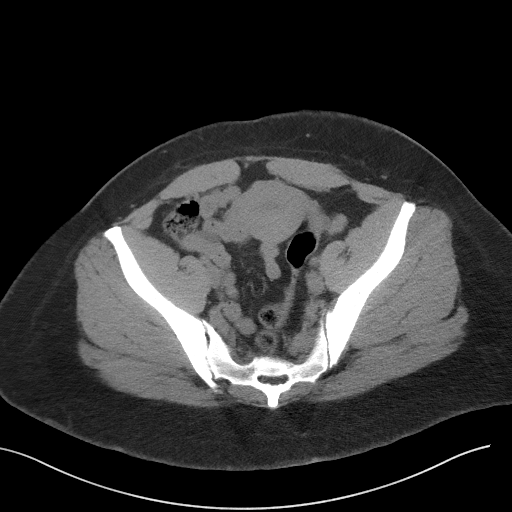
[im 44/100  soft-tissue]
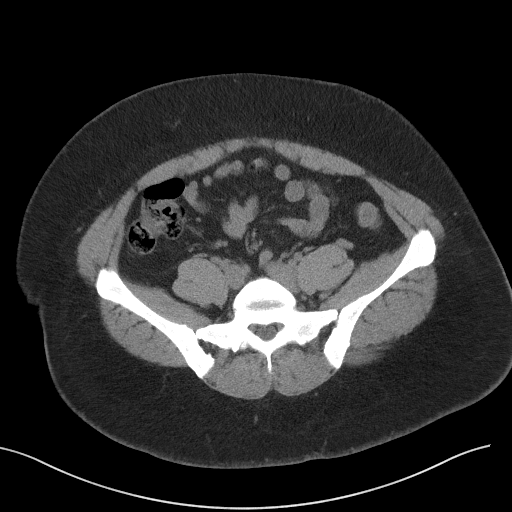
[im 52/100  soft-tissue]
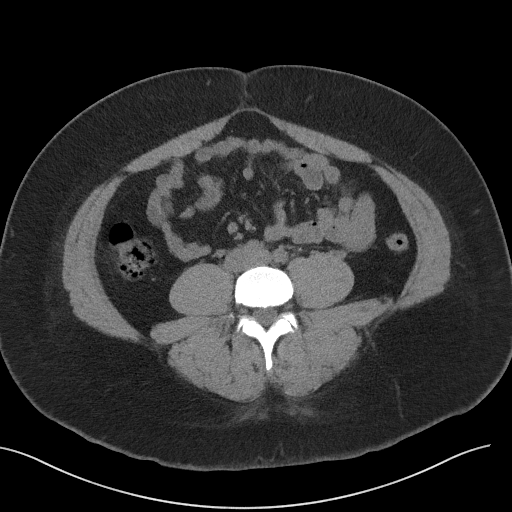
[im 56/100  soft-tissue]
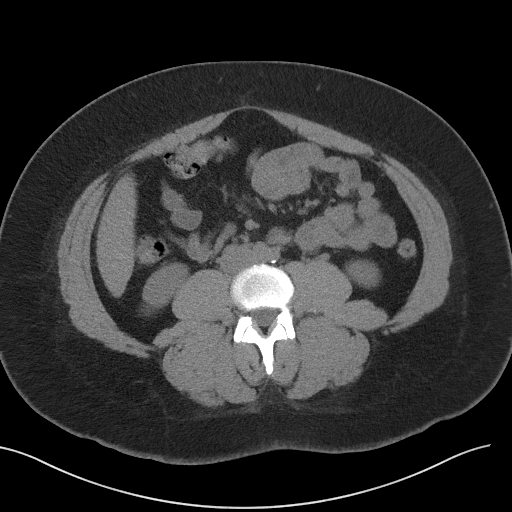
[im 65/100  soft-tissue]
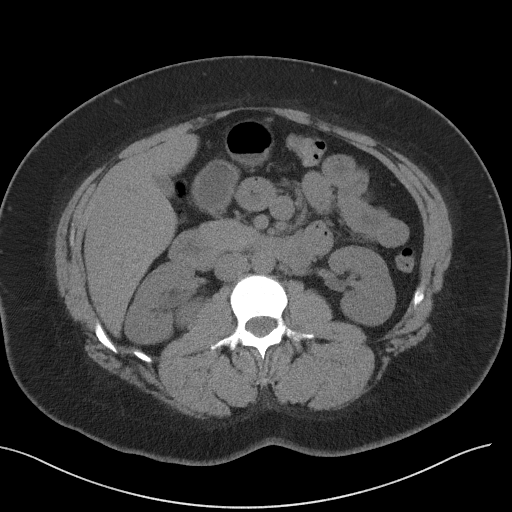
[im 65/100  bone]
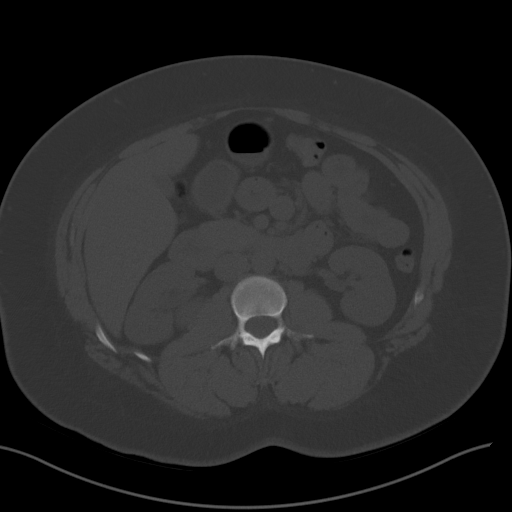
[im 74/100  soft-tissue]
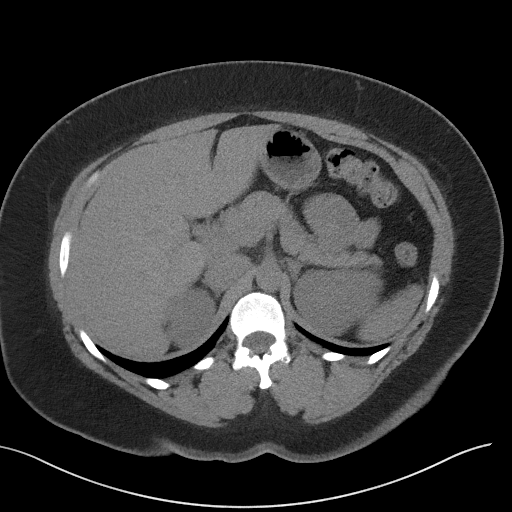
[im 78/100  soft-tissue]
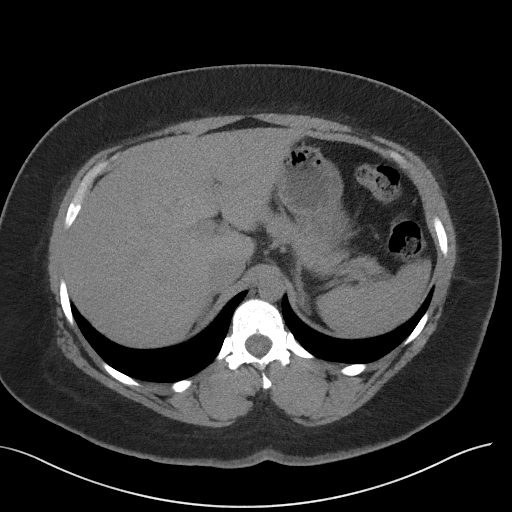
[im 87/100  soft-tissue]
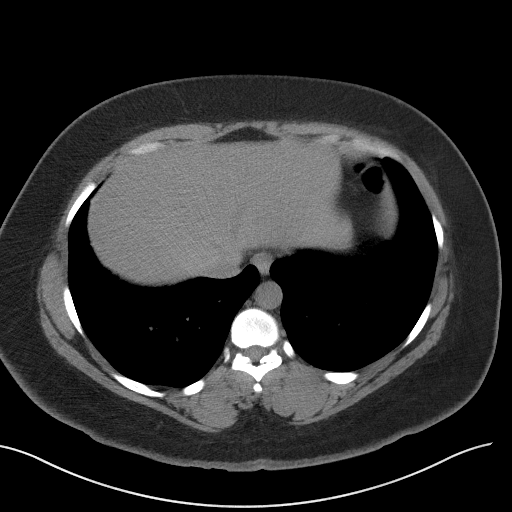
[im 95/100  soft-tissue]
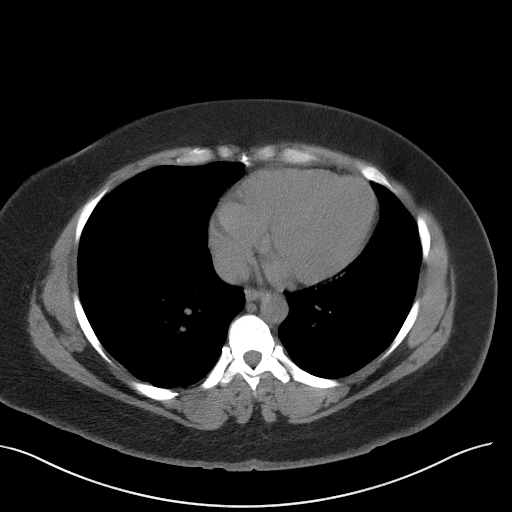

[Series 5: coronal · coronal · 0.83mm/px · 3 of 135 slices shown]
[im 45/135  soft-tissue]
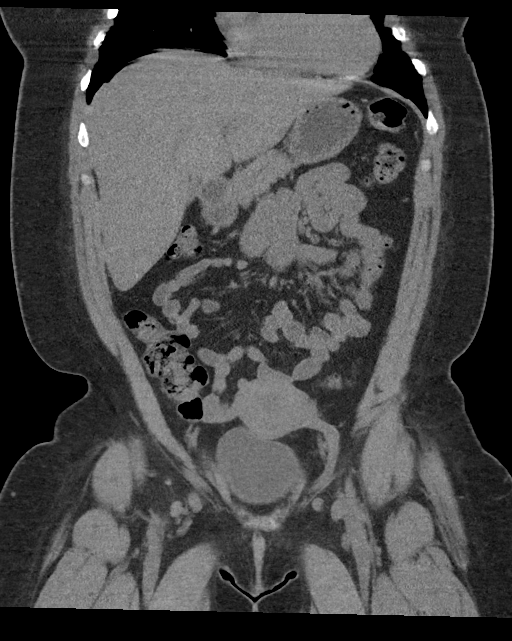
[im 60/135  soft-tissue]
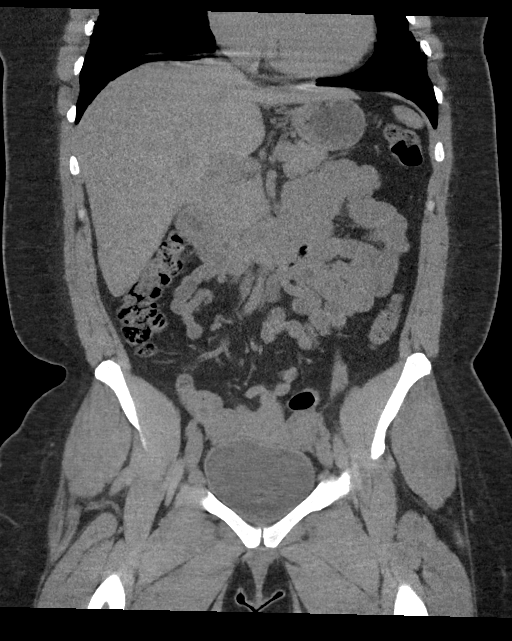
[im 75/135  soft-tissue]
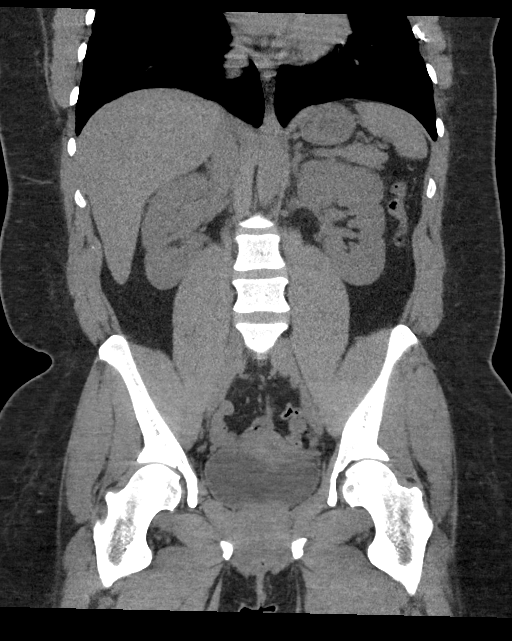

[16 of 46 positions shown; findings below may reference images not displayed]

FINDINGS: Lower chest: The lung bases are clear. No pleural effusion. The
heart is normal in size. No pericardial effusion. The distal
esophagus is grossly normal.

Hepatobiliary: No focal hepatic lesions or intrahepatic biliary
dilatation. Gallbladder is contracted. No common bile duct
dilatation.

Pancreas: No mass, inflammation or ductal dilatation.

Spleen: Normal size.  No focal lesions.

Adrenals/Urinary Tract: The adrenal glands and kidneys are
unremarkable. No renal, ureteral or bladder calculi or mass.

Stomach/Bowel: The stomach, duodenum, small bowel and colon are
grossly normal without oral contrast. No inflammatory changes, mass
lesions or obstructive findings. The terminal ileum and appendix are
normal.

Vascular/Lymphatic: The aorta is normal in caliber. Small
atherosclerotic calcification noted involving the left proximal
iliac artery. No mesenteric or retroperitoneal mass or
lymphadenopathy.

Reproductive: The uterus and ovaries are unremarkable. Small
calcification noted near the cervix which may be in a nabothian
cyst.

Other: No pelvic mass or adenopathy. No free pelvic fluid
collections. No inguinal mass or adenopathy. Scattered bilateral
inguinal lymph nodes are likely normal. No abdominal wall hernia or
subcutaneous lesions.

Musculoskeletal: No acute bony findings. Early degenerative disc
disease noted at L5-S1.
IMPRESSION: 1. No CT findings to account for the patient's right flank pain. No
renal, ureteral or bladder calculi or mass. The appendix and ovaries
appear normal.
2. Early atherosclerotic calcifications, advanced for age.

## 2020-03-18 ENCOUNTER — Ambulatory Visit (INDEPENDENT_AMBULATORY_CARE_PROVIDER_SITE_OTHER): Payer: Managed Care, Other (non HMO) | Admitting: Internal Medicine

## 2020-03-18 VITALS — Ht 66.0 in | Wt 234.0 lb

## 2020-03-18 DIAGNOSIS — Z6837 Body mass index (BMI) 37.0-37.9, adult: Secondary | ICD-10-CM

## 2020-03-18 DIAGNOSIS — I10 Essential (primary) hypertension: Secondary | ICD-10-CM | POA: Diagnosis not present

## 2020-03-18 DIAGNOSIS — F339 Major depressive disorder, recurrent, unspecified: Secondary | ICD-10-CM

## 2020-03-18 DIAGNOSIS — G4730 Sleep apnea, unspecified: Secondary | ICD-10-CM

## 2020-03-18 NOTE — Progress Notes (Signed)
Sleep Medicine   Office Visit  Patient Name: Frances Turner DOB: Mar 29, 1981 MRN [id number] I connected with the patient at 9:00 a.m. by telephone and verified the patients identity using two identifiers.   I discussed the limitations, risks, security and privacy concerns of performing an evaluation and management service by telephone and the availability of in person appointments. I also discussed with the patient that there may be a patient responsible charge related to the service.  The patient expressed understanding and agrees to proceed.    Chief Complaint: study results  Brief History:  Ciana is here for initial consultation to discuss the results of her recent home studies. The findings were unremarkable but technically limited. She was referred because of hypertension and weight gain. She reports  long history of snoring. She feels that she sleeps well.  She does admit to reflux. She goes to bed between 8:30 and 9 p.m. and gets up at 6:20 am. She does not feel rested when she wakes. 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  Patient has noted no restlessness. of her legs at night.   The Epworth Sleepiness Score is 19 out of 24 .  The patient relates  Cardiovascular risk factors include: hypertension     ROS  General: (-) fever, (-) chills, (-) night sweat Nose and Sinuses: (-) nasal stuffiness or itchiness, (-) postnasal drip, (-) nosebleeds, (-) sinus trouble. Mouth and Throat: (-) sore throat, (-) hoarseness. Neck: (-) swollen glands, (-) enlarged thyroid, (-) neck pain. Respiratory: - cough, - shortness of breath, - wheezing. Neurologic: - numbness, - tingling. Psychiatric: - anxiety, - depression Sleep behavior: -sleep paralysis -hypnogogic hallucinations -dream enactment  -vivid dreams -cataplexy -night terrors -sleep walking   Current Medication: Outpatient Encounter Medications as of 03/18/2020  Medication Sig   cyclobenzaprine (FLEXERIL) 5 MG tablet Take by  mouth.   hydrochlorothiazide (HYDRODIURIL) 25 MG tablet Take by mouth.   Liraglutide -Weight Management (SAXENDA) 18 MG/3ML SOPN Inject SQ 0.6mg  daily for two  weeks, go up to 1.2mg  SQ daily for the following week   naproxen (NAPROSYN) 500 MG tablet TAKE 1 TABLET(500 MG) BY MOUTH TWICE DAILY WITH MEALS   sertraline (ZOLOFT) 50 MG tablet TAKE 1 TABLET(50 MG) BY MOUTH DAILY   ferrous gluconate (FERGON) 324 MG tablet Take 324 mg by mouth daily with breakfast.   fluticasone (FLONASE) 50 MCG/ACT nasal spray Place 2 sprays into both nostrils daily.   Multiple Vitamin (MULTI-VITAMIN) tablet Take by mouth.   OZEMPIC, 1 MG/DOSE, 4 MG/3ML SOPN INJECT 1 MG UNDER THE SKIN ONCE A WEEK   topiramate (TOPAMAX) 25 MG tablet    [DISCONTINUED] benzonatate (TESSALON) 200 MG capsule Take 1 capsule (200 mg total) by mouth 3 (three) times daily as needed for cough.   [DISCONTINUED] chlorpheniramine-HYDROcodone (TUSSIONEX PENNKINETIC ER) 10-8 MG/5ML SUER Take 5 mLs by mouth every 12 (twelve) hours as needed for cough.   [DISCONTINUED] ibuprofen (ADVIL,MOTRIN) 600 MG tablet Take 1 tablet (600 mg total) by mouth every 6 (six) hours as needed.   [DISCONTINUED] meloxicam (MOBIC) 15 MG tablet Take 1 tablet (15 mg total) by mouth daily.   [DISCONTINUED] metFORMIN (GLUCOPHAGE) 500 MG tablet Take one tablet each evening with supper.   [DISCONTINUED] methocarbamol (ROBAXIN) 500 MG tablet Take 1 tablet (500 mg total) by mouth 4 (four) times daily.   [DISCONTINUED] methylPREDNISolone (MEDROL DOSEPAK) 4 MG TBPK tablet Take Tapered dose as directed   No facility-administered encounter medications on file as of 03/18/2020.  Surgical History: Past Surgical History:  Procedure Laterality Date   CESAREAN SECTION      Medical History: Past Medical History:  Diagnosis Date   Diabetes mellitus without complication (HCC)     Family History: Non contributory to the present illness  Social History: Social  History   Socioeconomic History   Marital status: Married    Spouse name: Not on file   Number of children: Not on file   Years of education: Not on file   Highest education level: Not on file  Occupational History   Not on file  Tobacco Use   Smoking status: Current Every Day Smoker    Packs/day: 0.50   Smokeless tobacco: Never Used  Vaping Use   Vaping Use: Some days  Substance and Sexual Activity   Alcohol use: No   Drug use: No   Sexual activity: Not on file  Other Topics Concern   Not on file  Social History Narrative   Not on file   Social Determinants of Health   Financial Resource Strain:    Difficulty of Paying Living Expenses: Not on file  Food Insecurity:    Worried About Running Out of Food in the Last Year: Not on file   Ran Out of Food in the Last Year: Not on file  Transportation Needs:    Lack of Transportation (Medical): Not on file   Lack of Transportation (Non-Medical): Not on file  Physical Activity:    Days of Exercise per Week: Not on file   Minutes of Exercise per Session: Not on file  Stress:    Feeling of Stress : Not on file  Social Connections:    Frequency of Communication with Friends and Family: Not on file   Frequency of Social Gatherings with Friends and Family: Not on file   Attends Religious Services: Not on file   Active Member of Clubs or Organizations: Not on file   Attends Banker Meetings: Not on file   Marital Status: Not on file  Intimate Partner Violence:    Fear of Current or Ex-Partner: Not on file   Emotionally Abused: Not on file   Physically Abused: Not on file   Sexually Abused: Not on file    Vital Signs: Height 5\' 6"  (1.676 m), weight 234 lb (106.1 kg).  Examination: General Appearance: The patient is well-developed, well-nourished, and in no distress. Neck Circumference:  Skin: Gross inspection of skin unremarkable. Head: normocephalic, no gross  deformities. Eyes: no gross deformities noted. ENT: ears appear grossly normal Neurologic: Alert and oriented. No involuntary movements.    EPWORTH SLEEPINESS SCALE:  Scale:  (0)= no chance of dozing; (1)= slight chance of dozing; (2)= moderate chance of dozing; (3)= high chance of dozing  Chance  Situtation    Sitting and reading: 3    Watching TV: 2    Sitting Inactive in public: 3    As a passenger in car: 3      Lying down to rest: 3    Sitting and talking: 1    Sitting quielty after lunch: 3    In a car, stopped in traffic: 1   TOTAL SCORE:   19 out of 24    SLEEP STUDIES:  1. HST 10/20&21/21 NS but poor data and limited sleep   LABS: No results found for this or any previous visit (from the past 2160 hour(s)).  Radiology: No results found.  No results found.  No results found.  Assessment and Plan: Patient Active Problem List   Diagnosis Date Noted   Hypoglycemic reaction 11/11/2015     PLAN OSA:   Patient evaluation suggests high risk of sleep disordered breathing due to snoring, poorly refreshing sleep, reflux and severe daytime hypersomnia. The home study results were not reliable.  Patient has comorbid cardiovascular risk factors including: hypertension and morbid obesity. which could be exacerbated by pathologic sleep-disordered breathing.  Suggest: PSG to assess the patient's sleep disordered breathing.   1. OSA- PSG will be ordered. Obesity-Discussed the importance of weight management through healthy eating and daily exercise as tolerated. Discussed the negative effects obesity has on pulmonary health, cardiac health as well as overall general health and well being. Depression on sertraline HTN controlled   General Counseling: I have discussed the findings of the evaluation and examination with Ashni.  I have also discussed any further diagnostic evaluation thatmay be needed or ordered today. Aviance verbalizes understanding of  the findings of todays visit. We also reviewed her medications today and discussed drug interactions and side effects including but not limited excessive drowsiness and altered mental states. We also discussed that there is always a risk not just to her but also people around her. she has been encouraged to call the office with any questions or concerns that should arise related to todays visit.  This patient was seen by Theotis Burrow, AGNP-C in collaboration with Dr. Freda Munro as a part of collaborative care agreement.  I have personally obtained a history, evaluated the patient, evaluated pertinent data, formulated the assessment and plan and placed orders.   Valentino Hue Sol Blazing, PhD, FAASM  Diplomate, American Board of Sleep Medicine    Yevonne Pax, MD [facility] Diplomate ABMS Pulmonary and Critical Care Medicine Sleep medicine

## 2020-03-18 NOTE — Patient Instructions (Signed)

## 2020-09-14 DIAGNOSIS — F411 Generalized anxiety disorder: Secondary | ICD-10-CM | POA: Insufficient documentation

## 2021-07-06 ENCOUNTER — Other Ambulatory Visit: Payer: Self-pay

## 2021-07-06 ENCOUNTER — Emergency Department
Admission: EM | Admit: 2021-07-06 | Discharge: 2021-07-06 | Disposition: A | Discharge status: Home / Self Care | Payer: Managed Care, Other (non HMO) | Attending: Emergency Medicine | Admitting: Emergency Medicine

## 2021-07-06 ENCOUNTER — Emergency Department: Payer: Managed Care, Other (non HMO)

## 2021-07-06 DIAGNOSIS — E876 Hypokalemia: Secondary | ICD-10-CM | POA: Diagnosis not present

## 2021-07-06 DIAGNOSIS — E119 Type 2 diabetes mellitus without complications: Secondary | ICD-10-CM | POA: Insufficient documentation

## 2021-07-06 DIAGNOSIS — I10 Essential (primary) hypertension: Secondary | ICD-10-CM | POA: Insufficient documentation

## 2021-07-06 DIAGNOSIS — R0789 Other chest pain: Secondary | ICD-10-CM | POA: Insufficient documentation

## 2021-07-06 DIAGNOSIS — F419 Anxiety disorder, unspecified: Secondary | ICD-10-CM | POA: Insufficient documentation

## 2021-07-06 DIAGNOSIS — F410 Panic disorder [episodic paroxysmal anxiety] without agoraphobia: Secondary | ICD-10-CM

## 2021-07-06 LAB — CBC
HCT: 36.1 % (ref 36.0–46.0)
Hemoglobin: 11.6 g/dL — ABNORMAL LOW (ref 12.0–15.0)
MCH: 28.1 pg (ref 26.0–34.0)
MCHC: 32.1 g/dL (ref 30.0–36.0)
MCV: 87.4 fL (ref 80.0–100.0)
Platelets: 316 10*3/uL (ref 150–400)
RBC: 4.13 MIL/uL (ref 3.87–5.11)
RDW: 15.2 % (ref 11.5–15.5)
WBC: 16.5 10*3/uL — ABNORMAL HIGH (ref 4.0–10.5)
nRBC: 0 % (ref 0.0–0.2)

## 2021-07-06 LAB — BASIC METABOLIC PANEL
Anion gap: 8 (ref 5–15)
BUN: 16 mg/dL (ref 6–20)
CO2: 25 mmol/L (ref 22–32)
Calcium: 8.9 mg/dL (ref 8.9–10.3)
Chloride: 102 mmol/L (ref 98–111)
Creatinine, Ser: 0.95 mg/dL (ref 0.44–1.00)
GFR, Estimated: >60 mL/min (ref >60)
Glucose, Bld: 112 mg/dL — ABNORMAL HIGH (ref 70–99)
Potassium: 2.9 mmol/L — ABNORMAL LOW (ref 3.5–5.1)
Sodium: 135 mmol/L (ref 135–145)

## 2021-07-06 LAB — TROPONIN I (HIGH SENSITIVITY)
Troponin I (High Sensitivity): 4 ng/L (ref <18)
Troponin I (High Sensitivity): 6 ng/L (ref <18)

## 2021-07-06 MED ORDER — POTASSIUM CHLORIDE CRYS ER 20 MEQ PO TBCR
40.0000 meq | EXTENDED_RELEASE_TABLET | Freq: Once | ORAL | Status: AC
  Administered 2021-07-06: 40 meq via ORAL
  Filled 2021-07-06: qty 2

## 2021-07-06 NOTE — ED Triage Notes (Addendum)
Pt arrived via POV with reports of chest pressure, pt states she was cleaning her nose with a q-tip and felt a snap, pt states she has pressure in her ears, c/o being anxious and having chest pain. Pt is concerned she has an aneurysm, concerned about BP and repeatedly states she is "scared"  Pt also reports using marijuana tonight, states she started feeling this way after smoking.

## 2021-07-06 NOTE — ED Provider Notes (Signed)
[facility] Provider Note    Event Date/Time   First MD Initiated Contact with Patient 07/06/21 [phone]     (approximate)   History   Chest Pain   HPI  Tabbetha Turner is a 41 y.o. female with a history of obesity, hypertension, diabetes, anxiety who presents for evaluation of anxiety.  Patient reports that she has been very congested due to her allergies.  She reports that she was trying to blow her nose but could not so she put a Q-tip inside her nose.  Something came out attached with a Q-tip and she was concerned it could have been a nerve, part of her body, or an aneurysm.  She felt very anxious, started having tightness in her chest, felt like she was going to pass out.  She continues to feel anxious at this time.  She just wanted to make sure that walking out of her nose was not anything concerning.  She denies any current chest pain.  She denies shortness of breath.  She denies fever or chills.     Past Medical History:  Diagnosis Date   Anxiety    Diabetes mellitus without complication (HCC)    HTN (hypertension)    Obesity     Past Surgical History:  Procedure Laterality Date   CESAREAN SECTION       Physical Exam   Triage Vital Signs: ED Triage Vitals  Enc Vitals Group     BP 07/06/21 0048 (!) 149/108     Pulse Rate 07/06/21 0048 (!) 103     Resp 07/06/21 0048 18     Temp 07/06/21 0048 98.5 F (36.9 C)     Temp Source 07/06/21 0048 Oral     SpO2 07/06/21 0048 100 %     Weight 07/06/21 0051 220 lb (99.8 kg)     Height 07/06/21 0051 5\' 7"  (1.702 m)     Head Circumference --      Peak Flow --      Pain Score 07/06/21 0050 9     Pain Loc --      Pain Edu? --      Excl. in [place]? --     Most recent vital signs: Vitals:   07/06/21 0048 07/06/21 0300  BP: (!) 149/108 (!) 150/106  Pulse: (!) 103 83  Resp: 18 13  Temp: 98.5 F (36.9 C)   SpO2: 100% 100%     Constitutional: Alert and oriented. Well appearing and in no apparent  distress. HEENT:      Head: Normocephalic and atraumatic.         Eyes: Conjunctivae are normal. Sclera is non-icteric.       Nose: thick white secretions bilaterally      Mouth/Throat: Mucous membranes are moist.       Neck: Supple with no signs of meningismus. Cardiovascular: Regular rate and rhythm. No murmurs, gallops, or rubs. 2+ symmetrical distal pulses are present in all extremities.  Respiratory: Normal respiratory effort. Lungs are clear to auscultation bilaterally.  Gastrointestinal: Soft, non tender, and non distended with positive bowel sounds. No rebound or guarding. Genitourinary: No CVA tenderness. Musculoskeletal:  No edema, cyanosis, or erythema of extremities. Neurologic: Normal speech and language. Face is symmetric. Moving all extremities. No gross focal neurologic deficits are appreciated. Skin: Skin is warm, dry and intact. No rash noted. Psychiatric: Mood and affect are normal. Speech and behavior are normal.  ED Results / Procedures / Treatments   Labs (all  labs ordered are listed, but only abnormal results are displayed) Labs Reviewed  BASIC METABOLIC PANEL - Abnormal; Notable for the following components:      Result Value   Potassium 2.9 (*)    Glucose, Bld 112 (*)    All other components within normal limits  CBC - Abnormal; Notable for the following components:   WBC 16.5 (*)    Hemoglobin 11.6 (*)    All other components within normal limits  POC URINE PREG, ED  TROPONIN I (HIGH SENSITIVITY)  TROPONIN I (HIGH SENSITIVITY)     EKG  ED ECG REPORT I, Rudene Re, the attending physician, personally viewed and interpreted this ECG.  Normal sinus rhythm with a rate of 99, normal intervals, normal axis, no ST elevations or depressions.   RADIOLOGY I, Rudene Re, attending MD, have personally viewed and interpreted the images obtained during this visit as below:  Chest x-ray with no acute  finding   ___________________________________________________ Interpretation by Radiologist:  DG Chest 2 View  Result Date: 07/06/2021 CLINICAL DATA:  Chest pain EXAM: CHEST - 2 VIEW COMPARISON:  10/15/2013 FINDINGS: Cardiac and mediastinal contours are within normal limits. No focal pulmonary opacity. No pleural effusion or pneumothorax. No acute osseous abnormality. IMPRESSION: No acute cardiopulmonary process. Electronically Signed   By: Merilyn Baba M.D.   On: 07/06/2021 01:17      PROCEDURES:  Critical Care performed: No  Procedures    IMPRESSION / MDM / ASSESSMENT AND PLAN / ED COURSE  I reviewed the triage vital signs and the nursing notes.  41 y.o. female with a history of obesity, hypertension, diabetes, anxiety who presents for evaluation of anxiety.  Patient had a panic attack at home after removing a Q-tip from her nose.  She reports that something was attached with a Q-tip and she was concerned it was an aneurysm or a nerve.  Patient immediately became extremely anxious, hyperventilating with chest tightness and felt like she was going to pass out.  She still slightly anxious but markedly improved.  No longer having any shortness of breath or chest tightness.  Examination of her nose shows no signs of trauma only thick clear discharge.  Ddx: Most likely a panic attack given patient's concerns.  Since she was complaining of chest tightness we will get an EKG and troponins to rule out ACS.  Chest x-ray to rule out pneumonia.  Patient declined COVID and flu.   Plan: EKG, troponin x2, chest x-ray, BMP, CBC.  Patient placed on telemetry for close monitoring of cardiorespiratory status.   MEDICATIONS GIVEN IN ED: Medications  potassium chloride SA (KLOR-CON M) CR tablet 40 mEq (40 mEq Oral Given 07/06/21 0313)     ED COURSE: Chest x-ray negative for pneumonia.  EKG and troponin x2 with no signs of ischemia.  Labs showing mild hypokalemia with no EKG changes which was  supplemented p.o.  No other electrolyte derangements.  Patient does have an elevated white count of 16.5 possibly viral considering her congestion.  She does not want COVID or flu swabs.  It could also be stress-induced from her panic attack.  Patient was monitored on telemetry with no signs of cardiorespiratory distress.  Admission was considered but felt unnecessary with negative work-up and symptoms that have resolved.  Recommended close follow-up with her primary care doctor.   Consults: None   EMR reviewed including last visit with her PCP from a month ago for diabetes and obesity    FINAL CLINICAL IMPRESSION(S) /  ED DIAGNOSES   Final diagnoses:  Anxiety attack     Rx / DC Orders   ED Discharge Orders     None        Note:  This document was prepared using Dragon voice recognition software and may include unintentional dictation errors.   Please note:  Patient was evaluated in Emergency Department today for the symptoms described in the history of present illness. Patient was evaluated in the context of the global COVID-19 pandemic, which necessitated consideration that the patient might be at risk for infection with the SARS-CoV-2 virus that causes COVID-19. Institutional protocols and algorithms that pertain to the evaluation of patients at risk for COVID-19 are in a state of rapid change based on information released by regulatory bodies including the CDC and federal and state organizations. These policies and algorithms were followed during the patient's care in the ED.  Some ED evaluations and interventions may be delayed as a result of limited staffing during the pandemic.       Alfred Levins, [place], MD 07/06/21 [phone]

## 2022-04-16 ENCOUNTER — Emergency Department: Payer: Managed Care, Other (non HMO)

## 2022-04-16 ENCOUNTER — Emergency Department
Admission: EM | Admit: 2022-04-16 | Discharge: 2022-04-17 | Discharge status: Against Medical Advice | Payer: Managed Care, Other (non HMO) | Attending: Emergency Medicine | Admitting: Emergency Medicine

## 2022-04-16 DIAGNOSIS — R0789 Other chest pain: Secondary | ICD-10-CM | POA: Diagnosis not present

## 2022-04-16 DIAGNOSIS — Z5321 Procedure and treatment not carried out due to patient leaving prior to being seen by health care provider: Secondary | ICD-10-CM | POA: Insufficient documentation

## 2022-04-16 DIAGNOSIS — R079 Chest pain, unspecified: Secondary | ICD-10-CM | POA: Diagnosis present

## 2022-04-16 LAB — CBC
HCT: 36.8 % (ref 36.0–46.0)
Hemoglobin: 11.7 g/dL — ABNORMAL LOW (ref 12.0–15.0)
MCH: 28.1 pg (ref 26.0–34.0)
MCHC: 31.8 g/dL (ref 30.0–36.0)
MCV: 88.5 fL (ref 80.0–100.0)
Platelets: 312 10*3/uL (ref 150–400)
RBC: 4.16 MIL/uL (ref 3.87–5.11)
RDW: 15.6 % — ABNORMAL HIGH (ref 11.5–15.5)
WBC: 13.9 10*3/uL — ABNORMAL HIGH (ref 4.0–10.5)
nRBC: 0 % (ref 0.0–0.2)

## 2022-04-16 LAB — BASIC METABOLIC PANEL
Anion gap: 6 (ref 5–15)
BUN: 19 mg/dL (ref 6–20)
CO2: 24 mmol/L (ref 22–32)
Calcium: 9.1 mg/dL (ref 8.9–10.3)
Chloride: 110 mmol/L (ref 98–111)
Creatinine, Ser: 0.84 mg/dL (ref 0.44–1.00)
GFR, Estimated: >60 mL/min (ref >60)
Glucose, Bld: 74 mg/dL (ref 70–99)
Potassium: 3.6 mmol/L (ref 3.5–5.1)
Sodium: 140 mmol/L (ref 135–145)

## 2022-04-16 LAB — TROPONIN I (HIGH SENSITIVITY): Troponin I (High Sensitivity): 3 ng/L (ref <18)

## 2022-04-16 LAB — POC URINE PREG, ED: Preg Test, Ur: NEGATIVE

## 2022-04-16 NOTE — ED Provider Triage Note (Signed)
Emergency Medicine Provider Triage Evaluation Note  Linnaea Ahn, a 41 y.o. female  was evaluated in triage.  Pt complains of chest pain.  Reports several days of anterior chest pain moved across her chest with physical movement.  Presents to the ED for evaluation of her symptoms.  Review of Systems  Positive: Chest (wall) pain Negative: FCS  Physical Exam  Ht 5\' 7"  (1.702 m)   Wt 97.5 kg   BMI 33.67 kg/m  Gen:   Awake, no distress  NAD Resp:  Normal effort CTA MSK:   Moves extremities without difficulty  Other:    Medical Decision Making  Medically screening exam initiated at 10:05 PM.  Appropriate orders placed.  Santa Abdelrahman was informed that the remainder of the evaluation will be completed by another provider, this initial triage assessment does not replace that evaluation, and the importance of remaining in the ED until their evaluation is complete.  Patient to the ED for evaluation of anterior chest pain aggravated by physical movement.  She reports several days of symptoms intermittently.   Burman Freestone, PA-C 04/16/22 2206

## 2022-04-16 NOTE — ED Triage Notes (Signed)
Pt reports CP/pressure for a few days, started on right side then radiated to the left side when she turned to roll over. Pain started while she was at work sitting. Movement increase the pain. Pain to inhale or take a deep breath. Pain is intermittent and sharpe. No hx of same, no cardiac hx. Hx of HTN. NAD noted, GCS 15

## 2022-04-17 NOTE — ED Notes (Signed)
Pt has been called 3rd and 4th time, no answer, pt will be taken off the board at this time

## 2022-04-17 NOTE — ED Notes (Signed)
Pt called for 2nd time, no answer, pt still not seen in [facility] lobby.

## 2022-04-17 NOTE — ED Notes (Signed)
Pt called for repeat trop, no answer at this time, pt not seen in [facility] lobby

## 2022-11-25 DIAGNOSIS — Z7985 Long-term (current) use of injectable non-insulin antidiabetic drugs: Secondary | ICD-10-CM | POA: Diagnosis not present

## 2022-11-25 DIAGNOSIS — E119 Type 2 diabetes mellitus without complications: Secondary | ICD-10-CM | POA: Diagnosis not present

## 2022-11-25 DIAGNOSIS — J302 Other seasonal allergic rhinitis: Secondary | ICD-10-CM | POA: Diagnosis not present

## 2022-11-25 DIAGNOSIS — Z6837 Body mass index (BMI) 37.0-37.9, adult: Secondary | ICD-10-CM | POA: Diagnosis not present

## 2022-11-25 DIAGNOSIS — F17210 Nicotine dependence, cigarettes, uncomplicated: Secondary | ICD-10-CM | POA: Diagnosis not present

## 2022-11-25 DIAGNOSIS — Z791 Long term (current) use of non-steroidal anti-inflammatories (NSAID): Secondary | ICD-10-CM | POA: Diagnosis not present

## 2022-11-25 DIAGNOSIS — M2550 Pain in unspecified joint: Secondary | ICD-10-CM | POA: Diagnosis not present

## 2022-11-25 DIAGNOSIS — F419 Anxiety disorder, unspecified: Secondary | ICD-10-CM | POA: Diagnosis not present

## 2022-11-25 DIAGNOSIS — I10 Essential (primary) hypertension: Secondary | ICD-10-CM | POA: Diagnosis not present

## 2023-05-02 DIAGNOSIS — R7989 Other specified abnormal findings of blood chemistry: Secondary | ICD-10-CM | POA: Insufficient documentation

## 2023-05-21 DIAGNOSIS — E042 Nontoxic multinodular goiter: Secondary | ICD-10-CM | POA: Diagnosis not present

## 2023-06-09 DIAGNOSIS — E119 Type 2 diabetes mellitus without complications: Secondary | ICD-10-CM | POA: Diagnosis not present

## 2023-06-09 DIAGNOSIS — F419 Anxiety disorder, unspecified: Secondary | ICD-10-CM | POA: Diagnosis not present

## 2023-06-09 DIAGNOSIS — Z7985 Long-term (current) use of injectable non-insulin antidiabetic drugs: Secondary | ICD-10-CM | POA: Diagnosis not present

## 2023-06-09 DIAGNOSIS — J302 Other seasonal allergic rhinitis: Secondary | ICD-10-CM | POA: Diagnosis not present

## 2023-06-09 DIAGNOSIS — F17210 Nicotine dependence, cigarettes, uncomplicated: Secondary | ICD-10-CM | POA: Diagnosis not present

## 2023-06-09 DIAGNOSIS — I10 Essential (primary) hypertension: Secondary | ICD-10-CM | POA: Diagnosis not present

## 2023-06-09 DIAGNOSIS — E669 Obesity, unspecified: Secondary | ICD-10-CM | POA: Diagnosis not present

## 2023-06-09 DIAGNOSIS — F325 Major depressive disorder, single episode, in full remission: Secondary | ICD-10-CM | POA: Diagnosis not present

## 2023-06-09 DIAGNOSIS — Z6834 Body mass index (BMI) 34.0-34.9, adult: Secondary | ICD-10-CM | POA: Diagnosis not present

## 2024-01-07 ENCOUNTER — Emergency Department: Admission: EM | Admit: 2024-01-07 | Discharge: 2024-01-07 | Disposition: A | Discharge status: Home / Self Care

## 2024-01-07 ENCOUNTER — Encounter: Payer: Self-pay | Admitting: Emergency Medicine

## 2024-01-07 ENCOUNTER — Emergency Department

## 2024-01-07 ENCOUNTER — Other Ambulatory Visit: Payer: Self-pay

## 2024-01-07 DIAGNOSIS — E119 Type 2 diabetes mellitus without complications: Secondary | ICD-10-CM | POA: Diagnosis not present

## 2024-01-07 DIAGNOSIS — R1084 Generalized abdominal pain: Secondary | ICD-10-CM | POA: Insufficient documentation

## 2024-01-07 DIAGNOSIS — I10 Essential (primary) hypertension: Secondary | ICD-10-CM | POA: Diagnosis not present

## 2024-01-07 DIAGNOSIS — D72829 Elevated white blood cell count, unspecified: Secondary | ICD-10-CM | POA: Diagnosis not present

## 2024-01-07 LAB — CBC
HCT: 35.2 % — ABNORMAL LOW (ref 36.0–46.0)
Hemoglobin: 11.7 g/dL — ABNORMAL LOW (ref 12.0–15.0)
MCH: 29 pg (ref 26.0–34.0)
MCHC: 33.2 g/dL (ref 30.0–36.0)
MCV: 87.3 fL (ref 80.0–100.0)
Platelets: 280 K/uL (ref 150–400)
RBC: 4.03 MIL/uL (ref 3.87–5.11)
RDW: 15.2 % (ref 11.5–15.5)
WBC: 12.4 K/uL — ABNORMAL HIGH (ref 4.0–10.5)
nRBC: 0 % (ref 0.0–0.2)

## 2024-01-07 LAB — COMPREHENSIVE METABOLIC PANEL WITH GFR
ALT: 15 U/L (ref 0–44)
AST: 26 U/L (ref 15–41)
Albumin: 3.5 g/dL (ref 3.5–5.0)
Alkaline Phosphatase: 75 U/L (ref 38–126)
Anion gap: 9 (ref 5–15)
BUN: 13 mg/dL (ref 6–20)
CO2: 23 mmol/L (ref 22–32)
Calcium: 8.9 mg/dL (ref 8.9–10.3)
Chloride: 109 mmol/L (ref 98–111)
Creatinine, Ser: 0.78 mg/dL (ref 0.44–1.00)
GFR, Estimated: >60 mL/min (ref >60)
Glucose, Bld: 67 mg/dL — ABNORMAL LOW (ref 70–99)
Potassium: 4.2 mmol/L (ref 3.5–5.1)
Sodium: 141 mmol/L (ref 135–145)
Total Bilirubin: 0.9 mg/dL (ref 0.0–1.2)
Total Protein: 7.5 g/dL (ref 6.5–8.1)

## 2024-01-07 LAB — URINALYSIS, ROUTINE W REFLEX MICROSCOPIC
Bilirubin Urine: NEGATIVE
Glucose, UA: NEGATIVE mg/dL
Hgb urine dipstick: NEGATIVE
Ketones, ur: NEGATIVE mg/dL
Leukocytes,Ua: NEGATIVE
Nitrite: NEGATIVE
Protein, ur: NEGATIVE mg/dL
Specific Gravity, Urine: >1.046 — ABNORMAL HIGH (ref 1.005–1.030)
pH: 7 (ref 5.0–8.0)

## 2024-01-07 LAB — CBG MONITORING, ED: Glucose-Capillary: 73 mg/dL (ref 70–99)

## 2024-01-07 LAB — LIPASE, BLOOD: Lipase: 38 U/L (ref 11–51)

## 2024-01-07 LAB — HCG, QUANTITATIVE, PREGNANCY: hCG, Beta Chain, Quant, S: <1 m[IU]/mL (ref <5)

## 2024-01-07 MED ORDER — DICYCLOMINE HCL 10 MG PO CAPS: 10.0000 mg | ORAL_CAPSULE | Freq: Three times a day (TID) | ORAL | 0 refills | Status: AC

## 2024-01-07 MED ORDER — KETOROLAC TROMETHAMINE 10 MG PO TABS: 10.0000 mg | ORAL_TABLET | Freq: Four times a day (QID) | ORAL | 0 refills | Status: AC | PRN

## 2024-01-07 MED ORDER — KETOROLAC TROMETHAMINE 15 MG/ML IJ SOLN
15.0000 mg | Freq: Once | INTRAMUSCULAR | Status: AC
  Administered 2024-01-07: 15 mg via INTRAVENOUS
  Filled 2024-01-07: qty 1

## 2024-01-07 MED ORDER — DICYCLOMINE HCL 10 MG PO CAPS
10.0000 mg | ORAL_CAPSULE | Freq: Once | ORAL | Status: AC
  Administered 2024-01-07: 10 mg via ORAL
  Filled 2024-01-07: qty 1

## 2024-01-07 MED ORDER — KETOROLAC TROMETHAMINE 10 MG PO TABS: 10.0000 mg | ORAL_TABLET | Freq: Four times a day (QID) | ORAL | 0 refills | Status: DC | PRN

## 2024-01-07 MED ORDER — IOHEXOL 300 MG/ML  SOLN
100.0000 mL | Freq: Once | INTRAMUSCULAR | Status: AC | PRN
  Administered 2024-01-07: 100 mL via INTRAVENOUS

## 2024-01-07 MED ORDER — DICYCLOMINE HCL 10 MG PO CAPS: 10.0000 mg | ORAL_CAPSULE | Freq: Three times a day (TID) | ORAL | 0 refills | Status: DC

## 2024-01-07 NOTE — Progress Notes (Signed)
 Abdominal pain x 2 days

## 2024-01-07 NOTE — ED Provider Notes (Signed)
 [facility] Provider Note    Event Date/Time   First MD Initiated Contact with Patient 01/07/24 1929     (approximate)   History   Abdominal Pain   HPI  Frances Turner is a 43 y.o. female with PMH of hypertension, diabetes and obesity who presents for evaluation of abdominal pain that began on Saturday.  Patient reports a sharp stabbing intermittent pain.  She denies nausea and vomiting.  She had some diarrhea this morning, no constipation.  Denies urinary symptoms.  Pain is worse when walking around and when riding in the car.  She thought she was having gas pain as she was bloated so she has tried taking Pepto-Bismol, magnesium citrate and other stool softeners.      Physical Exam   Triage Vital Signs: ED Triage Vitals [01/07/24 1623]  Encounter Vitals Group     BP (!) 158/83     Girls Systolic BP Percentile      Girls Diastolic BP Percentile      Boys Systolic BP Percentile      Boys Diastolic BP Percentile      Pulse Rate 71     Resp 17     Temp 98.9 F (37.2 C)     Temp Source Oral     SpO2 98 %     Weight      Height      Head Circumference      Peak Flow      Pain Score 7     Pain Loc      Pain Education      Exclude from Growth Chart     Most recent vital signs: Vitals:   01/07/24 1623 01/07/24 1934  BP: (!) 158/83 (!) 147/77  Pulse: 71 81  Resp: 17 18  Temp: 98.9 F (37.2 C) 98.6 F (37 C)  SpO2: 98% 100%   General: Awake, very uncomfortable. CV:  Good peripheral perfusion.  RRR. Resp:  Normal effort.  CTAB. Abd:  Distended abdomen, nonlocalized tenderness to palpation, soft, bowel sounds appropriate.    ED Results / Procedures / Treatments   Labs (all labs ordered are listed, but only abnormal results are displayed) Labs Reviewed  COMPREHENSIVE METABOLIC PANEL WITH GFR - Abnormal; Notable for the following components:      Result Value   Glucose, Bld 67 (*)    All other components within normal limits  CBC -  Abnormal; Notable for the following components:   WBC 12.4 (*)    Hemoglobin 11.7 (*)    HCT 35.2 (*)    All other components within normal limits  URINALYSIS, ROUTINE W REFLEX MICROSCOPIC - Abnormal; Notable for the following components:   Color, Urine YELLOW (*)    APPearance CLEAR (*)    Specific Gravity, Urine >1.046 (*)    All other components within normal limits  LIPASE, BLOOD  HCG, QUANTITATIVE, PREGNANCY  CBG MONITORING, ED  POC URINE PREG, ED    RADIOLOGY  CT abdomen pelvis obtained, interpreted the images as well as reviewed the radiologist report.  Images showed a cyst on the left ovary but did not have any other acute abdominal findings.  PROCEDURES:  Critical Care performed: No  Procedures   MEDICATIONS ORDERED IN ED: Medications  iohexol  (OMNIPAQUE ) 300 MG/ML solution 100 mL (100 mLs Intravenous Contrast Given 01/07/24 1848)  ketorolac  (TORADOL ) 15 MG/ML injection 15 mg (15 mg Intravenous Given 01/07/24 2048)  dicyclomine  (BENTYL ) capsule 10 mg (10 mg  Oral Given 01/07/24 2047)     IMPRESSION / MDM / ASSESSMENT AND PLAN / ED COURSE  I reviewed the triage vital signs and the nursing notes.                             43 year old female presents for evaluation of abdominal pain.  Blood pressure is elevated, vital signs stable otherwise.  Patient does appear very uncomfortable on exam.  Differential diagnosis includes, but is not limited to, appendicitis, biliary disease, pancreatitis, constipation, gastroenteritis, diverticulitis.  Patient's presentation is most consistent with acute complicated illness / injury requiring diagnostic workup.  Lab work, UA and imaging obtained from triage.  Blood work is overall reassuring.  CT scan showed a cyst on the left ovary but did not have other acute findings.  Patient expressed concerns about her gallbladder.  I explained that her blood work was normal and did not lead me to believe she had an acute biliary disease.   However she is tender in the right upper quadrant so we will obtain right upper quadrant ultrasound just to be sure.    Patient did appear quite uncomfortable on my exam and describes the pain as crampy feeling.  She did drive herself to the emergency department and will need to be drive home so we will stick to Toradol  for pain control.  Also offered Bentyl  as an antispasmodic which patient was agreeable to.    Clinical Course as of 01/07/24 2234  Mon Jan 07, 2024  2032 Comprehensive metabolic panel(!) CMP shows low glucose, otherwise unremarkable. Patient is diabetic, will recheck and have patient drink some juice if needed. [LD]  2033 CBC(!) Mild leukocytosis and mild anemia, consistent with patient's baseline. [LD]  2117 Urinalysis, Routine w reflex microscopic -Urine, Clean Catch(!) Unremarkable, not consistent with infection. [LD]  2217 POC CBG, ED Not hypoglycemic, patient can eat if she would like. [LD]  2228 US  Abdomen Limited RUQ (LIVER/GB) Normal appearing gall bladder, bile duct, fatty liver. [LD]  2229 Patient reports an improvement in her pain after receiving the Toradol  and the Bentyl .  I am not exactly sure what is causing for her abdominal pain but do not feel that further emergent workup is needed.  Recommended that she follow-up with her primary care provider as this may be a side effect from the dose change in her Mounjaro.  Will also provide her with information for GI follow-up.  Will send a prescription for Toradol  and Bentyl .  Patient was given a note for work.  We reviewed return precautions.  She voiced understanding, all questions were answered and she was stable at discharge. [LD]    Clinical Course User Index [LD] Frances Tinnie LABOR, PA-C     FINAL CLINICAL IMPRESSION(S) / ED DIAGNOSES   Final diagnoses:  Generalized abdominal pain     Rx / DC Orders   ED Discharge Orders          Ordered    dicyclomine  (BENTYL ) 10 MG capsule  3 times daily before  meals & bedtime        01/07/24 2231    ketorolac  (TORADOL ) 10 MG tablet  Every 6 hours PRN        01/07/24 2231             Note:  This document was prepared using Dragon voice recognition software and may include unintentional dictation errors.   Frances Tinnie LABOR, PA-C 01/07/24 2234  Clarine Ozell LABOR, MD 01/09/24 [phone]

## 2024-01-07 NOTE — Discharge Instructions (Signed)
 Your blood work showed mild anemia today but was otherwise normal.  Your urinalysis did not show signs of infection.  Your CT scan showed a cyst on your left ovary but did not have any other abnormal findings.  The ultrasound of your gallbladder and liver showed some fatty deposits on the liver but was otherwise normal.  Please schedule a follow-up appointment with your primary care provider to discuss possible dosage changes to your Mounjaro as this may be causing your pain.  I have also attached information for a GI specialist to you can follow-up with as well.  I have sent a prescription for 2 medications.  The Bentyl  is to help with abdominal spasms.  This can be taken before meals and at bedtime.  The second is Toradol .  This is a pain medication.  Do not take any other NSAIDs while taking this medication.  This also increases the risk of GI bleeding so watch for dark black, tar-like stools.  If this occurs please return the emergency department.

## 2024-01-07 NOTE — ED Notes (Signed)
 Pharmacy called and notified that Toradol  may have infiltrated. IV site swollen and pt complaining of burning. Pharmacy stated no recommendation except for a warm compress. Warm towel applied.

## 2024-01-07 NOTE — ED Triage Notes (Signed)
 Pt reports abdominal pain around her umbilicus that started on Saturday. Denies N/V. Pt reports the pain is worse riding in the car and walking.

## 2024-02-12 DIAGNOSIS — E119 Type 2 diabetes mellitus without complications: Secondary | ICD-10-CM | POA: Insufficient documentation

## 2024-02-18 NOTE — Progress Notes (Deleted)
 02/19/2024 Frances Turner [id number] Aug 13, 1980  Gastroenterology Office Note    Referring Provider: Marston Setter, MD Primary Care Physician:  Marston Setter, MD  Primary GI Provider: Jinny Carmine, MD    Chief Complaint   No chief complaint on file.    History of Present Illness   Frances Turner is a 43 y.o. female with PMHX of diabetes, hypertension, anemia, obesity,  *** , presenting today at the request of Marston Setter, MD due to  Seen in ER for abd pain on 01/07/2024.  01/07/2024 CT abdomen pelvis with contrast No obstructive or inflammatory changes of the colon are noted.  IMPRESSION: 1. Normal-appearing appendix. 2. Left adnexal simple-appearing cyst measuring 3.2 cm. No follow-up imaging is recommended.   01/07/2024 US  abdomen right upper quadrant IMPRESSION: 1. Mild fatty infiltration of the liver. 2. Normal appearance of the gallbladder and bile ducts. No evidence of cholelithiasis or acute cholecystitis.     Past Medical History:  Diagnosis Date   Allergic rhinitis 07/25/2016   Anxiety    Class 2 severe obesity with serious comorbidity and body mass index (BMI) of 36.0 to 36.9 in adult 10/15/2018   Diabetes mellitus without complication (HCC)    Elevated LFTs 05/02/2023   GAD (generalized anxiety disorder) 09/14/2020   HTN (hypertension)    Hypertension 06/24/2018   Impingement syndrome of shoulder region 06/13/2018   Obesity    Suspected sleep apnea 10/15/2018   Thyroid  nodule 06/19/2018   Thyromegaly 01/21/2019   Tobacco abuse counseling 11/12/2018   Tobacco use 11/12/2018   Type 2 diabetes mellitus without complication, without long-term current use of insulin (HCC) 02/12/2024    Past Surgical History:  Procedure Laterality Date   CESAREAN SECTION     Rt Foot Sx      Current Outpatient Medications  Medication Sig Dispense Refill   cyclobenzaprine  (FLEXERIL ) 5 MG tablet Take by mouth.     dicyclomine  (BENTYL ) 10 MG  capsule Take 1 capsule (10 mg total) by mouth 4 (four) times daily -  before meals and at bedtime for 7 days. 28 capsule 0   ferrous gluconate (FERGON) 324 MG tablet Take 324 mg by mouth daily with breakfast.     fluticasone  (FLONASE ) 50 MCG/ACT nasal spray Place 2 sprays into both nostrils daily. 16 g 0   hydrochlorothiazide (HYDRODIURIL) 25 MG tablet Take by mouth.     ketorolac  (TORADOL ) 10 MG tablet Take 1 tablet (10 mg total) by mouth every 6 (six) hours as needed. 20 tablet 0   Liraglutide -Weight Management (SAXENDA) 18 MG/3ML SOPN Inject SQ 0.6mg  daily for two  weeks, go up to 1.2mg  SQ daily for the following week     Multiple Vitamin (MULTI-VITAMIN) tablet Take by mouth.     naproxen (NAPROSYN) 500 MG tablet TAKE 1 TABLET(500 MG) BY MOUTH TWICE DAILY WITH MEALS     OZEMPIC, 1 MG/DOSE, 4 MG/3ML SOPN INJECT 1 MG UNDER THE SKIN ONCE A WEEK     sertraline (ZOLOFT) 50 MG tablet TAKE 1 TABLET(50 MG) BY MOUTH DAILY     topiramate (TOPAMAX) 25 MG tablet      No current facility-administered medications for this visit.    Allergies as of 02/19/2024   (No Known Allergies)    Family History  Problem Relation Age of Onset   Diabetes Mother    Lupus Mother    Osteoarthritis Mother    Hyperlipidemia Mother    Diabetes Father    Hyperlipidemia Father  Hypertension Father     Social History   Socioeconomic History   Marital status: Married    Spouse name: Not on file   Number of children: Not on file   Years of education: Not on file   Highest education level: Not on file  Occupational History   Not on file  Tobacco Use   Smoking status: Former    Current packs/day: 0.50    Types: Cigarettes   Smokeless tobacco: Never  Vaping Use   Vaping status: Former  Substance and Sexual Activity   Alcohol use: Yes   Drug use: Yes    Types: Marijuana   Sexual activity: Yes  Other Topics Concern   Not on file  Social History Narrative   Not on file   Social Drivers of Health    Financial Resource Strain: Low Risk  (09/26/2023)   Received from [facility]   Overall Financial Resource Strain (CARDIA)    Difficulty of Paying Living Expenses: Not hard at all  Food Insecurity: No Food Insecurity (09/26/2023)   Received from [facility]   Hunger Vital Sign    Within the past 12 months, you worried that your food would run out before you got the money to buy more.: Never true    Within the past 12 months, the food you bought just didn't last and you didn't have money to get more.: Never true  Transportation Needs: No Transportation Needs (09/26/2023)   Received from [facility]   PRAPARE - Transportation    Lack of Transportation (Medical): No    Lack of Transportation (Non-Medical): No  Physical Activity: Insufficiently Active (08/31/2020)   Received from [facility]   Exercise Vital Sign    On average, how many days per week do you engage in moderate to strenuous exercise (like a brisk walk)?: 3 days    On average, how many minutes do you engage in exercise at this level?: 30 min  Stress: Not on file  Social Connections: Not on file  Intimate Partner Violence: Not At Risk (12/19/2023)   Received from [facility]   Humiliation, Afraid, Rape, and Kick questionnaire    Within the last year, have you been afraid of your partner or ex-partner?: No    Within the last year, have you been humiliated or emotionally abused in other ways by your partner or ex-partner?: No    Within the last year, have you been kicked, hit, slapped, or otherwise physically hurt by your partner or ex-partner?: No    Within the last year, have you been raped or forced to have any kind of sexual activity by your partner or ex-partner?: No     RELEVANT GI HISTORY, IMAGING AND LABS: CBC    Component Value Date/Time   WBC 12.4 (H) 01/07/2024 1624   RBC 4.03 01/07/2024 1624   HGB 11.7 (L) 01/07/2024 1624   HGB 10.9 (L) 06/06/2011 1001   HCT 35.2 (L) 01/07/2024 1624   HCT  30.1 (L) 06/08/2011 0733   PLT 280 01/07/2024 1624   PLT 196 06/06/2011 1001   MCV 87.3 01/07/2024 1624   MCV 89 06/06/2011 1001   MCH 29.0 01/07/2024 1624   MCHC 33.2 01/07/2024 1624   RDW 15.2 01/07/2024 1624   RDW 15.7 (H) 06/06/2011 1001   LYMPHSABS 2.1 06/06/2011 1001   MONOABS 0.7 06/06/2011 1001   EOSABS 0.1 06/06/2011 1001   BASOSABS 0.0 06/06/2011 1001   Recent Labs  01/07/24 1624  HGB 11.7*    CMP     Component Value Date/Time   NA 141 01/07/2024 1624   K 4.2 01/07/2024 1624   CL 109 01/07/2024 1624   CO2 23 01/07/2024 1624   GLUCOSE 67 (L) 01/07/2024 1624   BUN 13 01/07/2024 1624   CREATININE 0.78 01/07/2024 1624   CALCIUM 8.9 01/07/2024 1624   PROT 7.5 01/07/2024 1624   ALBUMIN 3.5 01/07/2024 1624   AST 26 01/07/2024 1624   ALT 15 01/07/2024 1624   ALKPHOS 75 01/07/2024 1624   BILITOT 0.9 01/07/2024 1624   GFRNONAA >60 01/07/2024 1624   GFRAA >60 12/27/2016 0722      Latest Ref Rng & Units 01/07/2024    4:24 PM 12/27/2016    7:22 AM  Hepatic Function  Total Protein 6.5 - 8.1 g/dL 7.5  7.6   Albumin 3.5 - 5.0 g/dL 3.5  3.8   AST 15 - 41 U/L 26  22   ALT 0 - 44 U/L 15  18   Alk Phosphatase 38 - 126 U/L 75  78   Total Bilirubin 0.0 - 1.2 mg/dL 0.9  0.6      Lipase     Component Value Date/Time   LIPASE 38 01/07/2024 1624     Review of Systems   All systems reviewed and negative except where noted in HPI.    Physical Exam  There were no vitals taken for this visit. No LMP recorded. General:   Alert and oriented. Pleasant and cooperative. Well-nourished and well-developed.  Head:  Normocephalic and atraumatic. Eyes:  Without icterus Ears:  Normal auditory acuity. Neck:  Supple; no masses or thyromegaly. Lungs:  Respirations even and unlabored.  Clear throughout to auscultation.   No wheezes, crackles, or rhonchi. No acute distress. Heart:  Regular rate and rhythm; no murmurs, clicks, rubs, or gallops. Abdomen:  Normal bowel sounds.  No  bruits.  Soft, non-tender and non-distended without masses, hepatosplenomegaly or hernias noted.  No guarding or rebound tenderness.  ***Negative Carnett sign.   Rectal:  Deferred. Msk:  Symmetrical without gross deformities. Normal posture. Extremities:  Without edema. Neurologic:  Alert and  oriented x4;  grossly normal neurologically. Skin:  Intact without significant lesions or rashes. Psych:  Alert and cooperative. Normal mood and affect.   Assessment & Plan   Frances Turner is a 43 y.o. female presenting today with   FIB-4   I discussed the assessment and treatment plan with the patient. The patient was provided an opportunity to ask questions and all were answered. The patient agreed with the plan and demonstrated an understanding of the instructions.   The patient was advised to call back or seek an in-person evaluation if the symptoms worsen or if the condition fails to improve as anticipated.  Grayce Bohr, DNP, AGNP-C [facility]

## 2024-02-19 ENCOUNTER — Ambulatory Visit: Admitting: Family Medicine

## 2024-02-20 IMAGING — CR DG CHEST 2V
2 series · 2 of 2 positions shown · non-contrast
Comparison: 10/15/2013

CLINICAL DATA: Chest pain

EXAM:
CHEST - 2 VIEW

[chest pa]
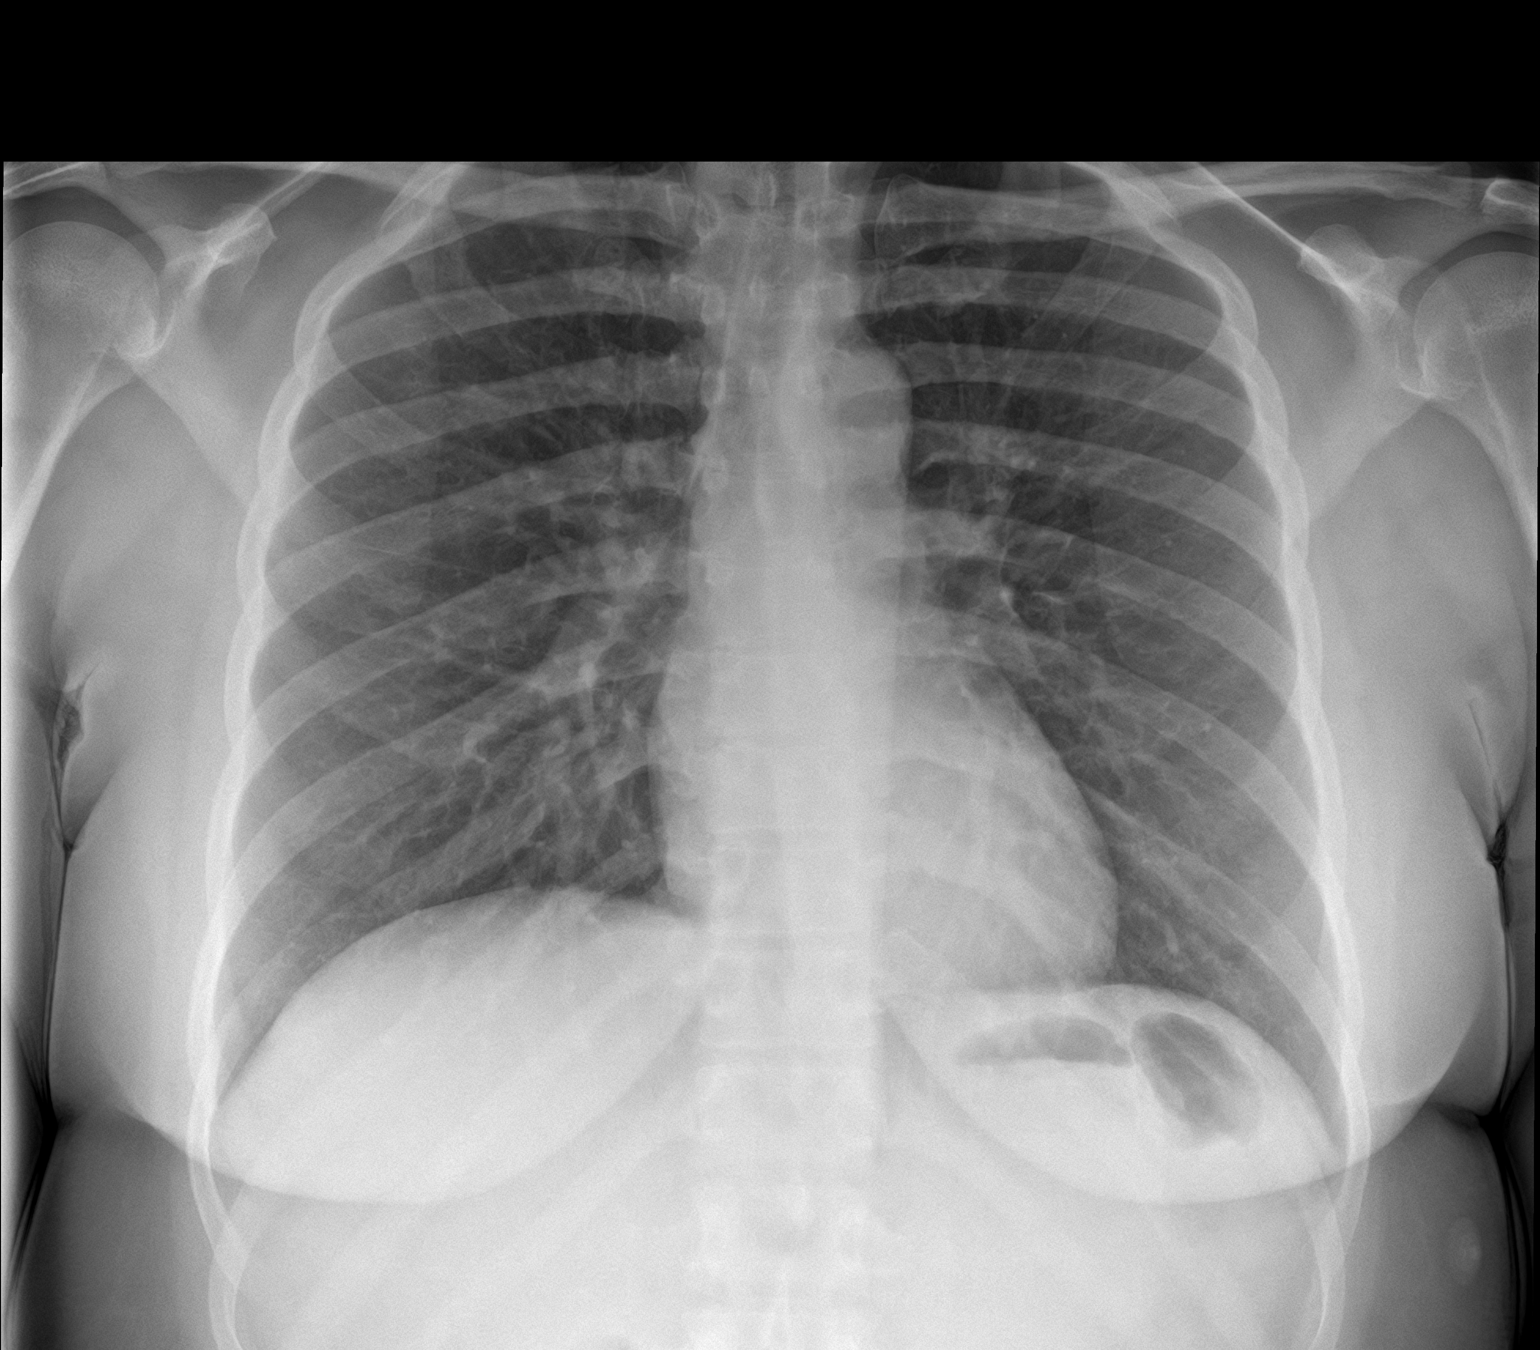

[chest lat]
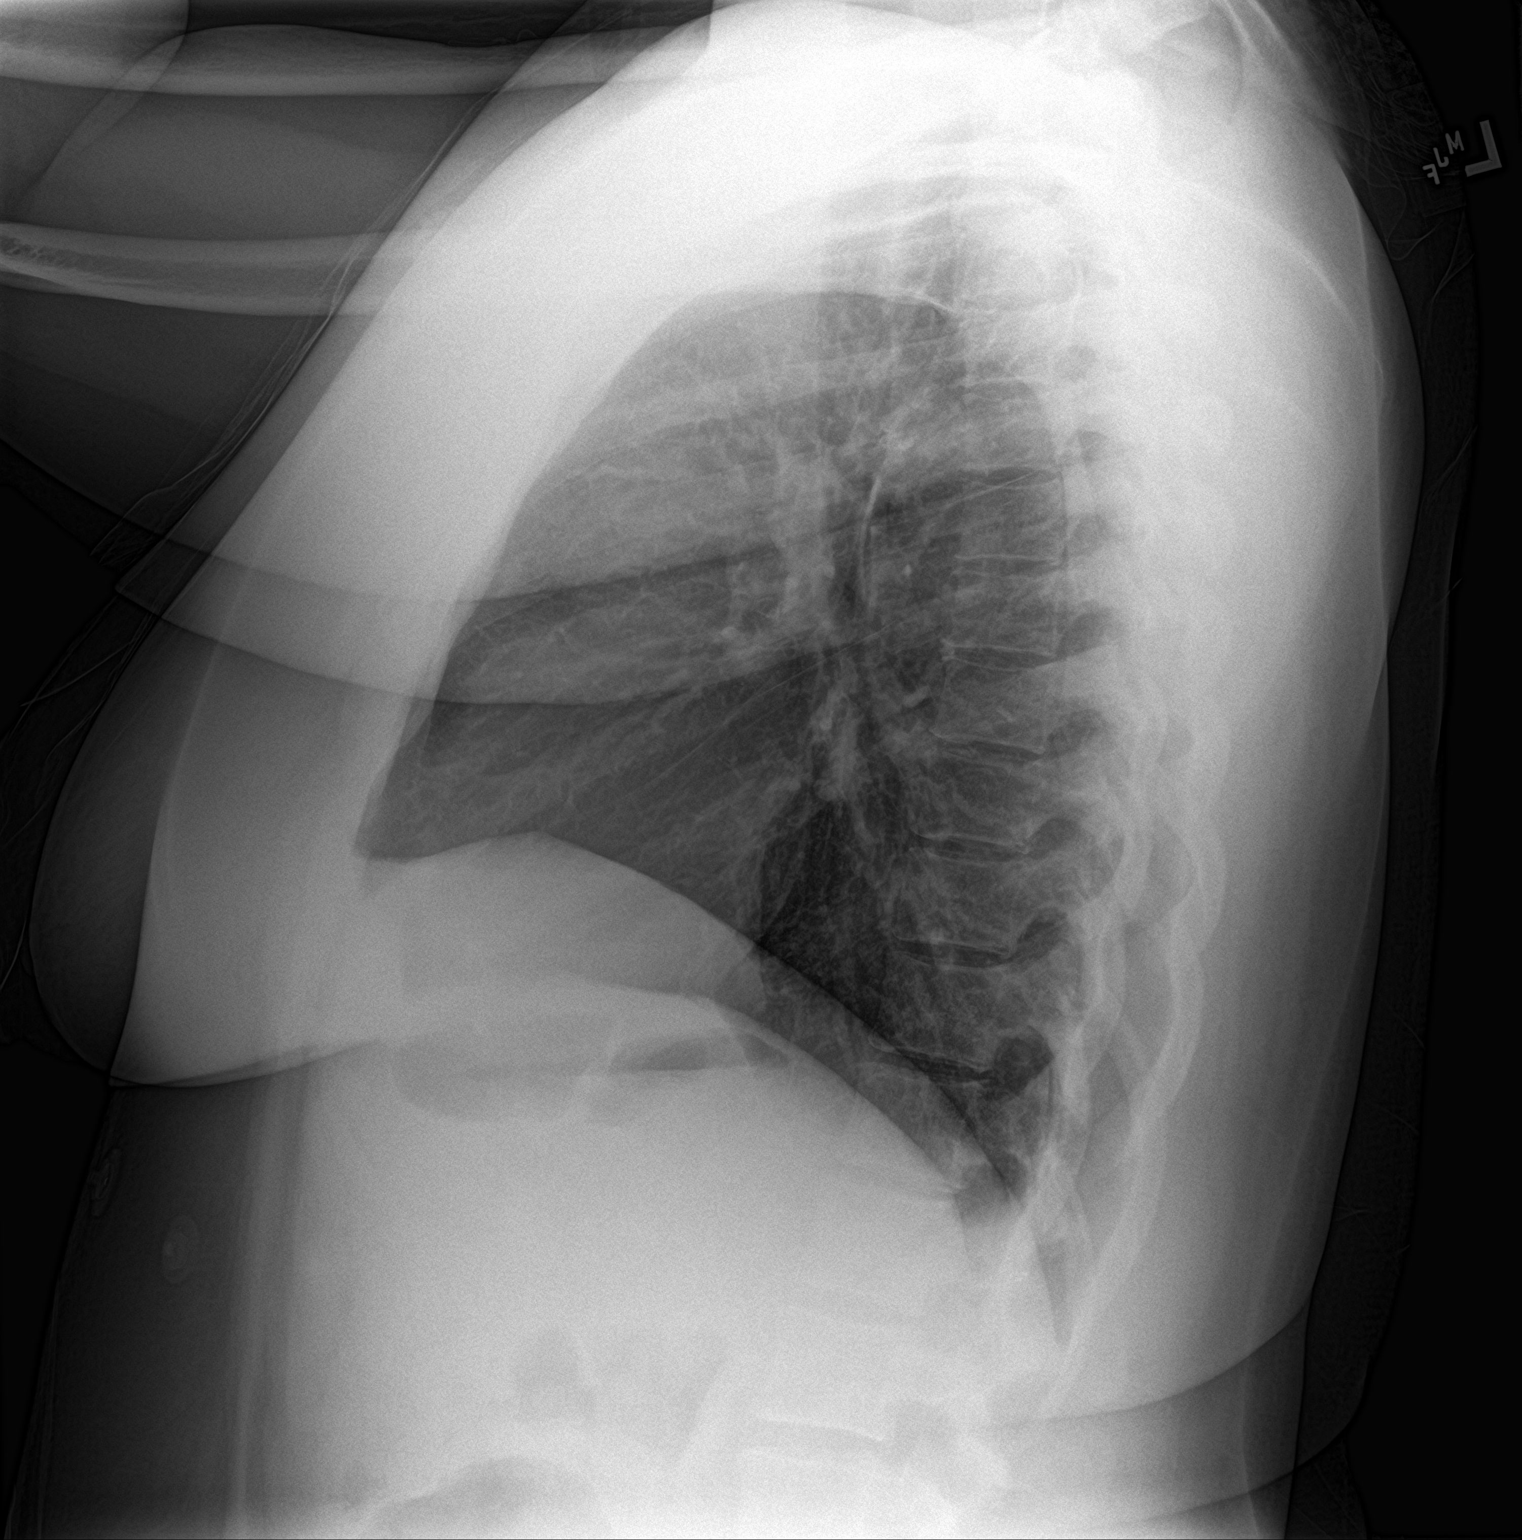

[2 of 2 positions shown; findings below may reference images not displayed]

FINDINGS: Cardiac and mediastinal contours are within normal limits. No focal
pulmonary opacity. No pleural effusion or pneumothorax. No acute
osseous abnormality.
IMPRESSION: No acute cardiopulmonary process.

## 2024-02-26 ENCOUNTER — Emergency Department
Admission: EM | Admit: 2024-02-26 | Discharge: 2024-02-26 | Disposition: A | Discharge status: Home / Self Care | Attending: Emergency Medicine | Admitting: Emergency Medicine

## 2024-02-26 ENCOUNTER — Other Ambulatory Visit: Payer: Self-pay

## 2024-02-26 DIAGNOSIS — W208XXA Other cause of strike by thrown, projected or falling object, initial encounter: Secondary | ICD-10-CM | POA: Diagnosis not present

## 2024-02-26 DIAGNOSIS — I10 Essential (primary) hypertension: Secondary | ICD-10-CM | POA: Insufficient documentation

## 2024-02-26 DIAGNOSIS — S199XXA Unspecified injury of neck, initial encounter: Secondary | ICD-10-CM | POA: Diagnosis present

## 2024-02-26 DIAGNOSIS — E119 Type 2 diabetes mellitus without complications: Secondary | ICD-10-CM | POA: Insufficient documentation

## 2024-02-26 DIAGNOSIS — S161XXA Strain of muscle, fascia and tendon at neck level, initial encounter: Secondary | ICD-10-CM | POA: Diagnosis not present

## 2024-02-26 MED ORDER — METHOCARBAMOL 500 MG PO TABS: 1000.0000 mg | ORAL_TABLET | Freq: Three times a day (TID) | ORAL | 0 refills | Status: AC

## 2024-02-26 MED ORDER — LIDOCAINE 5 % EX PTCH: 1.0000 | MEDICATED_PATCH | CUTANEOUS | 0 refills | Status: AC

## 2024-02-26 NOTE — ED Provider Notes (Signed)
 [facility] Provider Note    Event Date/Time   First MD Initiated Contact with Patient 02/26/24 1010     (approximate)   History   No chief complaint on file.   HPI  Frances Turner is a 43 y.o. female with PMH of diabetes, hypertension, obesity, anxiety who presents for evaluation of neck pain.  Patient states that 2 weeks ago a basketball hoop fell on her upper back causing some pain.  Patient presents to the emergency department today because she has significant pain with any movement of her neck.  No numbness, tingling or weakness in the hands.  No headaches or vision changes.  No fevers.      Physical Exam   Triage Vital Signs: ED Triage Vitals  Encounter Vitals Group     BP 02/26/24 1001 (!) 137/102     Girls Systolic BP Percentile --      Girls Diastolic BP Percentile --      Boys Systolic BP Percentile --      Boys Diastolic BP Percentile --      Pulse Rate 02/26/24 1001 77     Resp 02/26/24 1001 18     Temp 02/26/24 1001 97.8 F (36.6 C)     Temp Source 02/26/24 1001 Oral     SpO2 02/26/24 1001 99 %     Weight --      Height --      Head Circumference --      Peak Flow --      Pain Score 02/26/24 1004 10     Pain Loc --      Pain Education --      Exclude from Growth Chart --     Most recent vital signs: Vitals:   02/26/24 1001  BP: (!) 137/102  Pulse: 77  Resp: 18  Temp: 97.8 F (36.6 C)  SpO2: 99%   General: Awake, no distress.  CV:  Good peripheral perfusion.  RRR. Resp:  Normal effort.  CTAB. Abd:  No distention.  Other:  No tenderness to palpation over the cervical vertebrae or paraspinal muscles.  Neck range of motion significantly limited due to pain and does elicit pain.  Radial pulses are 2+ and regular.  Sensation maintained in bilateral upper extremities, strength is equal bilaterally.   ED Results / Procedures / Treatments   Labs (all labs ordered are listed, but only abnormal results are displayed) Labs  Reviewed - No data to display   PROCEDURES:  Critical Care performed: No  Procedures   MEDICATIONS ORDERED IN ED: Medications - No data to display   IMPRESSION / MDM / ASSESSMENT AND PLAN / ED COURSE  I reviewed the triage vital signs and the nursing notes.                             43 year old female presents for evaluation of neck pain.  Blood pressure is a little bit elevated otherwise vital signs are stable.  Patient very uncomfortable on exam.  Differential diagnosis includes, but is not limited to, muscle strain, cervical radiculopathy, less likely cervical spine fracture.  Patient's presentation is most consistent with acute, uncomplicated illness.  Did consider getting cervical spine x-ray but patient is not tender over the cervical vertebrae so feel that this would be low yield.  Feel that her pain is more consistent with a muscle strain as she has pain elicited with specific movements and  appears to be very stiff.  Discussed continuing over-the-counter pain control using both Tylenol  and Aleve.  Will also give patient a prescription for a muscle relaxer and lidocaine patches.  Patient was given a note for work.  She voiced understanding, all questions were answered and she was stable at discharge.       FINAL CLINICAL IMPRESSION(S) / ED DIAGNOSES   Final diagnoses:  Cervical strain, acute, initial encounter     Rx / DC Orders   ED Discharge Orders          Ordered    methocarbamol  (ROBAXIN ) 500 MG tablet  3 times daily        02/26/24 1032    lidocaine (LIDODERM) 5 %  Every 24 hours        02/26/24 1032             Note:  This document was prepared using Dragon voice recognition software and may include unintentional dictation errors.   Cleaster Tinnie LABOR, PA-C 02/26/24 1035    Arlander Charleston, MD 02/26/24 1352

## 2024-02-26 NOTE — Discharge Instructions (Addendum)
 She department for evaluation of fever and neck pain.  I believe this is due to a muscle strain.  You can take 650 mg of Tylenol  and 600 mg of ibuprofen  every 6 hours as needed for pain. You can use ice, heat, muscle creams and other topical pain relievers like the lidocaine patches as well.  I have sent a muscle relaxer to your pharmacy. This can be taken every 8 hours as needed for muscle spasms. This medication is sedating, so do not drive for 8 hours after taking it.

## 2024-02-26 NOTE — ED Triage Notes (Signed)
 Pt to ED via POV from home. Pt with hx of osteoarthritis. Pt reports a few weeks ago her son dropped a basketball goal on her injuring L5, neck and shoulders. Pt reports worsening pain.
